# Patient Record
Sex: Female | Born: 1980 | Race: White | Hispanic: No | Smoking: Current every day smoker
Health system: Southern US, Community
[De-identification: ages and names within clinical notes are randomized; demographics above are authoritative.]

## PROBLEM LIST (undated history)

## (undated) DIAGNOSIS — G8929 Other chronic pain: Secondary | ICD-10-CM

## (undated) DIAGNOSIS — F1910 Other psychoactive substance abuse, uncomplicated: Secondary | ICD-10-CM

## (undated) DIAGNOSIS — S322XXA Fracture of coccyx, initial encounter for closed fracture: Secondary | ICD-10-CM

## (undated) DIAGNOSIS — S22000A Wedge compression fracture of unspecified thoracic vertebra, initial encounter for closed fracture: Secondary | ICD-10-CM

## (undated) DIAGNOSIS — M549 Dorsalgia, unspecified: Secondary | ICD-10-CM

## (undated) DIAGNOSIS — N83209 Unspecified ovarian cyst, unspecified side: Secondary | ICD-10-CM

## (undated) DIAGNOSIS — IMO0002 Reserved for concepts with insufficient information to code with codable children: Secondary | ICD-10-CM

## (undated) HISTORY — PX: TUBAL LIGATION: SHX77

## (undated) HISTORY — PX: OVARIAN CYST REMOVAL: SHX89

## (undated) HISTORY — PX: LAPAROSCOPY: SHX197

---

## 2009-12-10 ENCOUNTER — Emergency Department (HOSPITAL_COMMUNITY): Admission: EM | Admit: 2009-12-10 | Discharge: 2009-12-10 | Payer: Self-pay | Admitting: Emergency Medicine

## 2010-02-27 ENCOUNTER — Emergency Department (HOSPITAL_COMMUNITY): Admission: EM | Admit: 2010-02-27 | Discharge: 2010-02-27 | Payer: Self-pay | Admitting: Emergency Medicine

## 2010-07-15 ENCOUNTER — Emergency Department (HOSPITAL_COMMUNITY)
Admission: EM | Admit: 2010-07-15 | Discharge: 2010-07-15 | Payer: Self-pay | Source: Home / Self Care | Admitting: Emergency Medicine

## 2010-10-04 LAB — HEPATIC FUNCTION PANEL
ALT: 31 U/L (ref 0–35)
Albumin: 4.1 g/dL (ref 3.5–5.2)
Alkaline Phosphatase: 64 U/L (ref 39–117)
Total Bilirubin: 0.5 mg/dL (ref 0.3–1.2)
Total Protein: 7.3 g/dL (ref 6.0–8.3)

## 2010-10-04 LAB — URINALYSIS, ROUTINE W REFLEX MICROSCOPIC
Glucose, UA: NEGATIVE mg/dL
Nitrite: NEGATIVE
Protein, ur: NEGATIVE mg/dL
pH: 5.5 (ref 5.0–8.0)

## 2010-10-04 LAB — POCT PREGNANCY, URINE: Preg Test, Ur: NEGATIVE

## 2010-10-04 LAB — CBC
HCT: 45.5 % (ref 36.0–46.0)
MCH: 32 pg (ref 26.0–34.0)
MCHC: 34.1 g/dL (ref 30.0–36.0)
Platelets: 299 10*3/uL (ref 150–400)
RBC: 4.84 MIL/uL (ref 3.87–5.11)

## 2010-10-04 LAB — POCT I-STAT, CHEM 8
BUN: 20 mg/dL (ref 6–23)
Chloride: 106 mEq/L (ref 96–112)
Creatinine, Ser: 0.8 mg/dL (ref 0.4–1.2)
HCT: 48 % — ABNORMAL HIGH (ref 36.0–46.0)
Hemoglobin: 16.3 g/dL — ABNORMAL HIGH (ref 12.0–15.0)
Potassium: 3.4 mEq/L — ABNORMAL LOW (ref 3.5–5.1)
TCO2: 26 mmol/L (ref 0–100)

## 2010-10-04 LAB — DIFFERENTIAL
Basophils Relative: 0 % (ref 0–1)
Lymphocytes Relative: 11 % — ABNORMAL LOW (ref 12–46)
Monocytes Absolute: 0.6 10*3/uL (ref 0.1–1.0)
Neutro Abs: 10 10*3/uL — ABNORMAL HIGH (ref 1.7–7.7)
Neutrophils Relative %: 83 % — ABNORMAL HIGH (ref 43–77)

## 2010-10-04 LAB — LIPASE, BLOOD: Lipase: 38 U/L (ref 11–59)

## 2010-11-29 ENCOUNTER — Emergency Department (HOSPITAL_COMMUNITY)
Admission: EM | Admit: 2010-11-29 | Discharge: 2010-11-29 | Discharge status: Against Medical Advice | Payer: Self-pay | Attending: Emergency Medicine | Admitting: Emergency Medicine

## 2010-11-29 DIAGNOSIS — R109 Unspecified abdominal pain: Secondary | ICD-10-CM | POA: Insufficient documentation

## 2010-12-25 ENCOUNTER — Emergency Department (HOSPITAL_COMMUNITY)
Admission: EM | Admit: 2010-12-25 | Discharge: 2010-12-25 | Disposition: A | Discharge status: Home / Self Care | Payer: Self-pay | Attending: Emergency Medicine | Admitting: Emergency Medicine

## 2010-12-25 ENCOUNTER — Emergency Department (HOSPITAL_COMMUNITY): Payer: Self-pay

## 2010-12-25 DIAGNOSIS — R109 Unspecified abdominal pain: Secondary | ICD-10-CM | POA: Insufficient documentation

## 2010-12-25 DIAGNOSIS — K089 Disorder of teeth and supporting structures, unspecified: Secondary | ICD-10-CM | POA: Insufficient documentation

## 2010-12-25 DIAGNOSIS — K029 Dental caries, unspecified: Secondary | ICD-10-CM | POA: Insufficient documentation

## 2010-12-25 DIAGNOSIS — K047 Periapical abscess without sinus: Secondary | ICD-10-CM | POA: Insufficient documentation

## 2010-12-25 DIAGNOSIS — R509 Fever, unspecified: Secondary | ICD-10-CM | POA: Insufficient documentation

## 2010-12-25 DIAGNOSIS — N390 Urinary tract infection, site not specified: Secondary | ICD-10-CM | POA: Insufficient documentation

## 2010-12-25 DIAGNOSIS — R300 Dysuria: Secondary | ICD-10-CM | POA: Insufficient documentation

## 2010-12-25 DIAGNOSIS — R350 Frequency of micturition: Secondary | ICD-10-CM | POA: Insufficient documentation

## 2010-12-25 LAB — CBC
HCT: 41.6 % (ref 36.0–46.0)
Hemoglobin: 14.5 g/dL (ref 12.0–15.0)
Platelets: 260 10*3/uL (ref 150–400)
RDW: 12.4 % (ref 11.5–15.5)

## 2010-12-25 LAB — DIFFERENTIAL
Basophils Absolute: 0 10*3/uL (ref 0.0–0.1)
Eosinophils Absolute: 0 10*3/uL (ref 0.0–0.7)
Lymphs Abs: 1.4 10*3/uL (ref 0.7–4.0)
Monocytes Absolute: 1.2 10*3/uL — ABNORMAL HIGH (ref 0.1–1.0)
Neutrophils Relative %: 85 % — ABNORMAL HIGH (ref 43–77)

## 2010-12-25 LAB — POCT I-STAT, CHEM 8
BUN: 4 mg/dL — ABNORMAL LOW (ref 6–23)
Calcium, Ion: 0.99 mmol/L — ABNORMAL LOW (ref 1.12–1.32)
Chloride: 107 meq/L (ref 96–112)
Creatinine, Ser: 0.8 mg/dL (ref 0.4–1.2)
Glucose, Bld: 106 mg/dL — ABNORMAL HIGH (ref 70–99)
HCT: 44 % (ref 36.0–46.0)
Hemoglobin: 15 g/dL (ref 12.0–15.0)
Potassium: 3.2 meq/L — ABNORMAL LOW (ref 3.5–5.1)
Sodium: 139 mEq/L (ref 135–145)
TCO2: 21 mmol/L (ref 0–100)

## 2010-12-25 LAB — URINALYSIS, ROUTINE W REFLEX MICROSCOPIC
Protein, ur: 30 mg/dL — AB
Specific Gravity, Urine: 1.02 (ref 1.005–1.030)
pH: 6 (ref 5.0–8.0)

## 2010-12-25 LAB — POCT PREGNANCY, URINE: Preg Test, Ur: NEGATIVE

## 2010-12-25 LAB — URINE MICROSCOPIC-ADD ON

## 2011-03-10 ENCOUNTER — Inpatient Hospital Stay (INDEPENDENT_AMBULATORY_CARE_PROVIDER_SITE_OTHER)
Admission: RE | Admit: 2011-03-10 | Discharge: 2011-03-10 | Disposition: A | Discharge status: Home / Self Care | Payer: Self-pay | Source: Ambulatory Visit | Attending: Emergency Medicine | Admitting: Emergency Medicine

## 2011-03-10 DIAGNOSIS — K089 Disorder of teeth and supporting structures, unspecified: Secondary | ICD-10-CM

## 2011-03-10 DIAGNOSIS — K069 Disorder of gingiva and edentulous alveolar ridge, unspecified: Secondary | ICD-10-CM

## 2011-11-22 ENCOUNTER — Emergency Department (HOSPITAL_COMMUNITY)
Admission: EM | Admit: 2011-11-22 | Discharge: 2011-11-22 | Disposition: A | Discharge status: Home / Self Care | Payer: Self-pay | Source: Home / Self Care | Attending: Emergency Medicine | Admitting: Emergency Medicine

## 2011-11-22 ENCOUNTER — Encounter (HOSPITAL_COMMUNITY): Payer: Self-pay | Admitting: *Deleted

## 2011-11-22 ENCOUNTER — Emergency Department (INDEPENDENT_AMBULATORY_CARE_PROVIDER_SITE_OTHER): Payer: Self-pay

## 2011-11-22 DIAGNOSIS — R319 Hematuria, unspecified: Secondary | ICD-10-CM

## 2011-11-22 DIAGNOSIS — M545 Low back pain, unspecified: Secondary | ICD-10-CM

## 2011-11-22 HISTORY — DX: Dorsalgia, unspecified: M54.9

## 2011-11-22 HISTORY — DX: Other chronic pain: G89.29

## 2011-11-22 HISTORY — DX: Fracture of coccyx, initial encounter for closed fracture: S32.2XXA

## 2011-11-22 HISTORY — DX: Unspecified ovarian cyst, unspecified side: N83.209

## 2011-11-22 HISTORY — DX: Wedge compression fracture of unspecified thoracic vertebra, initial encounter for closed fracture: S22.000A

## 2011-11-22 HISTORY — DX: Reserved for concepts with insufficient information to code with codable children: IMO0002

## 2011-11-22 LAB — POCT URINALYSIS DIP (DEVICE)
Bilirubin Urine: NEGATIVE
Nitrite: NEGATIVE
Protein, ur: 30 mg/dL — AB
pH: 6 (ref 5.0–8.0)

## 2011-11-22 MED ORDER — PREDNISONE 20 MG PO TABS: ORAL_TABLET | ORAL | Status: AC

## 2011-11-22 MED ORDER — HYDROCODONE-ACETAMINOPHEN 5-325 MG PO TABS: 2.0000 | ORAL_TABLET | ORAL | Status: AC | PRN

## 2011-11-22 MED ORDER — MELOXICAM 15 MG PO TABS: 15.0000 mg | ORAL_TABLET | Freq: Every day | ORAL | Status: AC

## 2011-11-22 MED ORDER — CYCLOBENZAPRINE HCL 10 MG PO TABS: 10.0000 mg | ORAL_TABLET | Freq: Three times a day (TID) | ORAL | Status: AC | PRN

## 2011-11-22 NOTE — ED Provider Notes (Signed)
History     CSN: [id number]  Arrival date & time 11/22/11  1515   First MD Initiated Contact with Patient 11/22/11 1601      Chief Complaint  Patient presents with  . URI    (Consider location/radiation/quality/duration/timing/severity/associated sxs/prior treatment) HPI Comments: Patient reports dull, achy midline lower back pain starting about a week ago after she slipped and fell, landing on her buttocks. Reports occasional "burning" sensation down the front of her right leg.  Pain is worse with flexion of her hips, standing up straight for prolonged periods of time, and getting out of bed. Pain is slightly better with bending forward. She is taking 400 mg ibuprofen without improvement. No nausea, vomiting, fevers, urinary complaints, vaginal complaints. Patient has a history of chronic low back pain, and states that this feels the worsening of her usual back pain. 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 No history of cancer, IVDU, prolonged steroid use.  Patient also reports nasal congestion, sneezing, rhinorrhea, URI-like symptoms, occasional nonproductive cough for the past 3 days. No nausea, vomiting, fevers, shortness of breath. Is taking DayQuil with some relief. 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 Patient's  primary concern is back pain and she would like to address that today.  ROS as noted in HPI. All other ROS negative.   Patient is a 31 y.o. female presenting with URI and back pain. The history is provided by the patient. No language interpreter was used.  URI Primary symptoms do not include fever or abdominal pain.  Back Pain  This is a recurrent problem. The current episode started more than 2 days ago. The problem occurs constantly. The problem has been gradually  worsening. The pain is associated with falling. The pain is present in the lumbar spine. The quality of the pain is described as aching. The pain radiates to the right thigh. The symptoms are aggravated by bending and certain positions. The pain is worse during the day. Stiffness is present in the morning. Associated symptoms include leg pain and paresthesias. Pertinent negatives include no chest pain, no fever, no numbness, no weight loss, no abdominal pain, no abdominal swelling, no bowel incontinence, no perianal numbness, no bladder incontinence, no dysuria, no pelvic pain, no paresis, no tingling and no weakness. She has tried NSAIDs for the symptoms. The treatment provided no relief.    Past Medical History  Diagnosis Date  . Cystic fibrosis   . DDD (degenerative disc disease)   . Thoracic compression fracture   . Back pain, chronic   . Coccygeal fracture   . Ovarian cyst     Past Surgical History  Procedure Date  . Laparoscopy   . Ovarian cyst removal   . Tubal ligation     Family History  Problem Relation Age of Onset  . Heart failure Mother   . Diabetes Mother   . Hypertension Father   . Heart failure Other   . Diabetes Other   . Cancer Other     History  Substance Use Topics  . Smoking status: Current Everyday Smoker -- 1.0 packs/day for 10 years    Types: Cigarettes  . Smokeless tobacco: Not on file  . Alcohol Use: Yes     occasional    OB History    Grav Para Term Preterm Abortions  TAB SAB Ect Mult Living                  Review of Systems  Constitutional: Negative for fever and weight loss.  Cardiovascular: Negative for chest pain.  Gastrointestinal: Negative for abdominal pain and bowel incontinence.  Genitourinary: Negative for bladder incontinence, dysuria and pelvic pain.  Musculoskeletal: Positive for back pain.  Neurological: Positive for paresthesias. Negative for tingling, weakness and numbness.    Allergies  Compazine and Phenergan  Home  Medications   Current Outpatient Rx  Name Route Sig Dispense Refill  . IBUPROFEN 400 MG PO TABS Oral Take 400 mg by mouth every 6 (six) hours as needed.    Marland Kitchen DAYQUIL PO Oral Take by mouth.      BP 123/74  Pulse 68  Temp(Src) 98.1 F (36.7 C) (Oral)  Resp 18  SpO2 100%  LMP 11/08/2011  Physical Exam  Nursing note and vitals reviewed. Constitutional: She is oriented to person, place, and time. She appears well-developed and well-nourished. No distress.  HENT:  Head: Normocephalic and atraumatic.  Eyes: Conjunctivae and EOM are normal.  Neck: Normal range of motion.  Cardiovascular: Normal rate and normal heart sounds.   Pulmonary/Chest: Effort normal and breath sounds normal.  Abdominal: Soft. Bowel sounds are normal. She exhibits no distension and no mass. There is no tenderness. There is no rebound and no guarding.  Musculoskeletal: Normal range of motion.       Lumbar back: She exhibits normal range of motion, no swelling and no edema.       Back:       Bilateral lower extremities nontender, baseline ROM with intact  PT pulses. No pain with PROM hips bilaterally. Pain in lumbar spine aggravated with SLR, but SLR neg bilaterally. Sensation baseline light touch bilaterally for Pt, DTR's symmetric and intact bilaterally KJ, pain aggravated with hip flexion. Motor symmetric bilateral 5/5 hip flexion, quadriceps, hamstrings, EHL, foot dorsiflexion, foot plantarflexion, gait somewhat antalgic but without apparent new ataxia.   Neurological: She is alert and oriented to person, place, and time.  Skin: Skin is warm and dry.  Psychiatric: She has a normal mood and affect. Her behavior is normal. Judgment and thought content normal.    ED Course  Procedures (including critical care time)  Labs Reviewed  POCT URINALYSIS DIP (DEVICE) - Abnormal; Notable for the following:    Hgb urine dipstick SMALL (*)    Protein, ur 30 (*)    All other components within normal limits  POCT  PREGNANCY, URINE   Dg Lumbar Spine Complete  11/22/2011  *RADIOLOGY REPORT*  Clinical Data: 31 year old female status post fall with pain.  LUMBAR SPINE - COMPLETE 4+ VIEW  Comparison: 02/27/2010. Chest radiographs 12/25/2010.  Findings: The lowest ribs appear to be those at the T12 level given the comparison chest.  Therefore, the L5 level with sacralized as designated on the 2011 comparison.  Chronic L3-L4 and L4-L5 disc space loss.  Stable mild retrolisthesis of L4 on L5.  Stable vertebral height and alignment. Other lumbar disc spaces are relatively preserved.  No pars fracture.  Left hemi pelvis tubal ligation clip. The second clip projects in the left upper quadrant as before.  SI joints within normal limits.  IMPRESSION: Stable lumbar spine with chronic L3-L4 and L4-L5 disc degeneration. Note transitional anatomy - correlation with radiographs is recommended prior to any operative intervention.  Original Report Authenticated By: Harley Hallmark, M.D.     No diagnosis found.  Results for orders placed during the hospital encounter of 11/22/11  POCT URINALYSIS DIP (DEVICE)      Component Value Range   Glucose, UA NEGATIVE  NEGATIVE (mg/dL)   Bilirubin Urine NEGATIVE  NEGATIVE    Ketones, ur NEGATIVE  NEGATIVE (mg/dL)   Specific Gravity, Urine 1.020  1.005 - 1.030    Hgb urine dipstick SMALL (*) NEGATIVE    pH 6.0  5.0 - 8.0    Protein, ur 30 (*) NEGATIVE (mg/dL)   Urobilinogen, UA 0.2  0.0 - 1.0 (mg/dL)   Nitrite NEGATIVE  NEGATIVE    Leukocytes, UA NEGATIVE  NEGATIVE   POCT PREGNANCY, URINE      Component Value Range   Preg Test, Ur NEGATIVE  NEGATIVE     MDM  Checking L-spine x-ray, as patient has history of trauma to the area, mild tenderness at L5/S1. Checking UA to rule out uti, nephrolithiasis.  [place] narcotic database reviewed. Pt with no narcotic rx since 01/04/2011.    X-rays reviewed by myself. Report per radiologist. Discussed imaging with patient. Patient says she is  unable to afford to see an orthopedic surgeon, but would like a referral to [facility]. Offered her referral to physical therapy, but patient states that this is not worked well for her in the past. Udip noted. Patient has no urinary complaints. Discussed results with patient. In the absence a history of kidney stones, CVA tenderness, suprapubic tenderness or other signs suggestive of  kidney stones or UTI, will have this reevaluated by a primary care physician.  Also discussed  following up with  the family practice center for ongoing routine medical care. She will need to have her urine rechecked to make sure there is not persistent hematuria. Patient agrees with plan     Luiz Blare, MD 11/22/11 2314

## 2011-11-22 NOTE — Discharge Instructions (Signed)
,   Family practice center first thing tomorrow. They will take her 80 patient to call, regardless of your insurance status. You need to have a primary care physician for routine medical care, and the followup on the blood that we found in urine today. Take medication as written. You may take 1 g of Tylenol up to 4 times a day. This with the meloxicam is an effective combination for pain. Do not take the Norco and the Tylenol, as he does have Tylenol in them, and too much can hurt your liver. Start doing the back pain exercises that you already have at home. Continue ice, and gentle stretching. Followup with the [facility] for ongoing management and physical therapy if needed. Return for fever above 100.4, urinary or bowel incontinence, numbness in legs, or any other concerns.

## 2011-11-22 NOTE — ED Notes (Signed)
1 week ago pt slipped and fell at home.  She has had constant low back  Pain radiating to the right leg since then, and it is worse today.     3 days ago she started having a runny nose , the next day she started coughing--somewhat productive.  Also she c/o left breast/chest area X 2 weeks.

## 2011-12-21 ENCOUNTER — Emergency Department (INDEPENDENT_AMBULATORY_CARE_PROVIDER_SITE_OTHER)
Admission: EM | Admit: 2011-12-21 | Discharge: 2011-12-21 | Disposition: A | Discharge status: Home / Self Care | Payer: Self-pay | Source: Home / Self Care | Attending: Family Medicine | Admitting: Family Medicine

## 2011-12-21 ENCOUNTER — Encounter (HOSPITAL_COMMUNITY): Payer: Self-pay | Admitting: *Deleted

## 2011-12-21 DIAGNOSIS — F419 Anxiety disorder, unspecified: Secondary | ICD-10-CM

## 2011-12-21 DIAGNOSIS — M62838 Other muscle spasm: Secondary | ICD-10-CM

## 2011-12-21 DIAGNOSIS — F411 Generalized anxiety disorder: Secondary | ICD-10-CM

## 2011-12-21 MED ORDER — HYDROXYZINE HCL 50 MG PO TABS: 50.0000 mg | ORAL_TABLET | Freq: Three times a day (TID) | ORAL | Status: AC | PRN

## 2011-12-21 MED ORDER — SODIUM CHLORIDE 0.9 % IV BOLUS (SEPSIS): 500.0000 mL | Freq: Once | INTRAVENOUS | Status: DC

## 2011-12-21 MED ORDER — CYCLOBENZAPRINE HCL 10 MG PO TABS: 10.0000 mg | ORAL_TABLET | Freq: Two times a day (BID) | ORAL | Status: AC | PRN

## 2011-12-21 NOTE — Discharge Instructions (Signed)
My impression is that you are experiencing anxiety symptoms. Also of your symptoms are more compatible with muscle sprain/spasm. Your EKG (electrocardiogram activity test) and physical examination are normal today. Take the prescribed medications as instructed. Be aware that Flexeril can make you drowsy and he should not take this medication before driving. Can take both medications at nighttime or what her home. During daytime he can take over-the-counter ibuprofen or Tylenol as needed for pain if you need to be alert and active. You can call the behavioral health outpatient clinic at [phone] to make an appointment, also the crisis help line works 24 hours 7 days a week and the number is [phone].

## 2011-12-21 NOTE — ED Provider Notes (Signed)
History     CSN: [id number]  Arrival date & time 12/21/11  1011   First MD Initiated Contact with Patient 12/21/11 1102      Chief Complaint  Patient presents with  . Back Pain    (Consider location/radiation/quality/duration/timing/severity/associated sxs/prior treatment) HPI Comments: 31 y/o female here c/o pain in left side of chest, breast and left shoulder for weeks. Pain triggered with movement of left arm. No worse with exertion. No known trauma. No nipple discharge. States she works in a [employer] and has to do a lot of physical work, from cooking to cleaning. She is right handed. No headache, dizziness, dysuria, fever or chills. No abdominal pain, N/V/D. Feels stressed and anxious. Worried all the time. Lives with 2 school age children.  Denies alcohol or illicit drug use. She smokes a lot. States her grand mother on father side had breast cancer and her father has CHF. Denies early death in the family due to CAD. "Just wants to make sure Im fine". No sleeping well. Denies suicidal or homicidal ideation. Has never been diagnosed with depression or anxiety. Does not have a PCP.    Past Medical History  Diagnosis Date  . Cystic fibrosis   . DDD (degenerative disc disease)   . Thoracic compression fracture   . Back pain, chronic   . Coccygeal fracture   . Ovarian cyst     Past Surgical History  Procedure Date  . Laparoscopy   . Ovarian cyst removal   . Tubal ligation     Family History  Problem Relation Age of Onset  . Heart failure Mother   . Diabetes Mother   . Hypertension Father   . Heart failure Other   . Diabetes Other   . Cancer Other     History  Substance Use Topics  . Smoking status: Current Everyday Smoker -- 1.0 packs/day for 10 years    Types: Cigarettes  . Smokeless tobacco: Not on file  . Alcohol Use: Yes     occasional    OB History    Grav Para Term Preterm Abortions TAB SAB Ect Mult Living                  Review of  Systems  Constitutional: Negative for fever, chills, diaphoresis, activity change, appetite change, fatigue and unexpected weight change.  Eyes: Negative for visual disturbance.  Respiratory: Negative for cough, chest tightness, shortness of breath and wheezing.   Genitourinary: Negative for dysuria, urgency, frequency, flank pain, vaginal bleeding, vaginal discharge and pelvic pain.  Musculoskeletal: Positive for back pain.       Has chronic back pain.  Skin: Negative for rash.  Neurological: Negative for dizziness, tremors, seizures, weakness, numbness and headaches.    Allergies  Compazine and Phenergan  Home Medications   Current Outpatient Rx  Name Route Sig Dispense Refill  . CYCLOBENZAPRINE HCL 10 MG PO TABS Oral Take 1 tablet (10 mg total) by mouth 2 (two) times daily as needed for muscle spasms. 20 tablet 0  . HYDROXYZINE HCL 50 MG PO TABS Oral Take 1 tablet (50 mg total) by mouth every 8 (eight) hours as needed for itching or anxiety. 30 tablet 0  . MELOXICAM 15 MG PO TABS Oral Take 1 tablet (15 mg total) by mouth daily. 14 tablet 0    BP 112/75  Pulse 104  Temp(Src) 98.4 F (36.9 C) (Oral)  Resp 18  SpO2 99%  LMP 11/08/2011  Physical Exam  Nursing note and vitals reviewed. Constitutional: She is oriented to person, place, and time. She appears well-developed and well-nourished. No distress.  HENT:  Head: Normocephalic and atraumatic.  Mouth/Throat: No oropharyngeal exudate.  Eyes: Conjunctivae are normal. No scleral icterus.  Neck: Normal range of motion. Neck supple. No JVD present.  Cardiovascular: Normal rate, regular rhythm, normal heart sounds and intact distal pulses.  Exam reveals no gallop and no friction rub.   No murmur heard. Pulmonary/Chest: Effort normal and breath sounds normal. No respiratory distress. She has no wheezes. She has no rales. She exhibits no tenderness.  Abdominal: Soft. She exhibits no mass. There is no tenderness.  Genitourinary:        Breast symmetric, no lumps. No skin pitting, thickening, erythema or retractions. No nipple discharge. No axillary adenopathy.  Musculoskeletal:       Diffused tenderness over left trapezius muscle, anterior shoulder joint and pectoralis muscle close to shoulder insertion.. Pain improved with arm abduction. No bruising or skin changes.   Lymphadenopathy:    She has no cervical adenopathy.  Neurological: She is alert and oriented to person, place, and time.  Skin: No rash noted.  Psychiatric: Her behavior is normal. Judgment and thought content normal. Her mood appears anxious. Her speech is rapid and/or pressured. Cognition and memory are normal.    ED Course  Procedures (including critical care time)  Labs Reviewed - No data to display No results found.   1. Anxiety   2. Muscle spasm       MDM  EKG: Normal sinus rhythm, rate 85, no arrhythmia no PVCs or PACs. No ischemic changes. Essentially normal EKG. Treated with hydroxyzine and flexeril. Behavioral health referral.         Sharin Grave, MD 12/22/11 [phone]

## 2012-01-04 ENCOUNTER — Emergency Department (HOSPITAL_COMMUNITY)
Admission: EM | Admit: 2012-01-04 | Discharge: 2012-01-05 | Disposition: A | Discharge status: Home / Self Care | Payer: Self-pay | Attending: Emergency Medicine | Admitting: Emergency Medicine

## 2012-01-04 DIAGNOSIS — G8929 Other chronic pain: Secondary | ICD-10-CM | POA: Insufficient documentation

## 2012-01-04 DIAGNOSIS — Z203 Contact with and (suspected) exposure to rabies: Secondary | ICD-10-CM | POA: Insufficient documentation

## 2012-01-04 DIAGNOSIS — Y92009 Unspecified place in unspecified non-institutional (private) residence as the place of occurrence of the external cause: Secondary | ICD-10-CM | POA: Insufficient documentation

## 2012-01-04 DIAGNOSIS — F17200 Nicotine dependence, unspecified, uncomplicated: Secondary | ICD-10-CM | POA: Insufficient documentation

## 2012-01-04 DIAGNOSIS — W5501XA Bitten by cat, initial encounter: Secondary | ICD-10-CM

## 2012-01-04 DIAGNOSIS — Z23 Encounter for immunization: Secondary | ICD-10-CM | POA: Insufficient documentation

## 2012-01-04 DIAGNOSIS — S61209A Unspecified open wound of unspecified finger without damage to nail, initial encounter: Secondary | ICD-10-CM | POA: Insufficient documentation

## 2012-01-04 DIAGNOSIS — IMO0001 Reserved for inherently not codable concepts without codable children: Secondary | ICD-10-CM | POA: Insufficient documentation

## 2012-01-04 DIAGNOSIS — IMO0002 Reserved for concepts with insufficient information to code with codable children: Secondary | ICD-10-CM | POA: Insufficient documentation

## 2012-01-04 NOTE — ED Notes (Signed)
Pt has been caring for a young kitten x last few days and was bit tonight.  She is worried it may have rabies.

## 2012-01-04 NOTE — ED Notes (Signed)
Pt states kitten that is 26 weeks old that has bite marks on neck and is acting "sick" bit her on thumb breaking the skin. Pt states she is concerned the cat has rabies. Pt denies any complaints.

## 2012-01-05 ENCOUNTER — Encounter (HOSPITAL_COMMUNITY): Payer: Self-pay

## 2012-01-05 MED ORDER — RABIES VACCINE, PCEC IM SUSR
1.0000 mL | Freq: Once | INTRAMUSCULAR | Status: AC
  Administered 2012-01-05: 1 mL via INTRAMUSCULAR
  Filled 2012-01-05: qty 1

## 2012-01-05 MED ORDER — RABIES IMMUNE GLOBULIN 150 UNIT/ML IM INJ
20.0000 [IU]/kg | INJECTION | Freq: Once | INTRAMUSCULAR | Status: AC
  Administered 2012-01-05: 150 [IU] via INTRAMUSCULAR
  Filled 2012-01-05: qty 9.5

## 2012-01-05 MED ORDER — TETANUS-DIPHTH-ACELL PERTUSSIS 5-2.5-18.5 LF-MCG/0.5 IM SUSP
0.5000 mL | Freq: Once | INTRAMUSCULAR | Status: AC
  Administered 2012-01-05: 0.5 mL via INTRAMUSCULAR
  Filled 2012-01-05: qty 0.5

## 2012-01-05 NOTE — Discharge Instructions (Signed)
You were seen and evaluated for your cat bite. At this time your providers recommend that he followup with animal control to have the cat watched and assessed for rabies. Your information was faxed to the animal control office. He should also followup by calling their office tomorrow. You should also watch for any signs for infection to the skin. This may include swelling, redness increasing pain, bleeding or discharge. Return to the emergency room if you develop any concerning signs for infection or if you develop any fever, chills, sweats, fatigue, body aches or headache.   Animal Bite An animal bite can result in a scratch on the skin, deep open cut, puncture of the skin, crush injury, or tearing away of the skin or a body part. Dogs are responsible for most animal bites. Children are bitten more often than adults. An animal bite can range from very mild to more serious. A small bite from your house pet is no cause for alarm. However, some animal bites can become infected or injure a bone or other tissue. You must seek medical care if:  The skin is broken and bleeding does not slow down or stop after 15 minutes.   The puncture is deep and difficult to clean (such as a cat bite).   Pain, warmth, redness, or pus develops around the wound.   The bite is from a stray animal or rodent. There may be a risk of rabies infection.   The bite is from a snake, raccoon, skunk, fox, coyote, or bat. There may be a risk of rabies infection.   The person bitten has a chronic illness such as diabetes, liver disease, or cancer, or the person takes medicine that lowers the immune system.   There is concern about the location and severity of the bite.  It is important to clean and protect an animal bite wound right away to prevent infection. Follow these steps:  Clean the wound with plenty of water and soap.   Apply an antibiotic cream.   Apply gentle pressure over the wound with a clean towel or gauze to  slow or stop bleeding.   Elevate the affected area above the heart to help stop any bleeding.   Seek medical care. Getting medical care within 8 hours of the animal bite leads to the best possible outcome.  DIAGNOSIS  Your caregiver will most likely:  Take a detailed history of the animal and the bite injury.   Perform a wound exam.   Take your medical history.  Blood tests or X-rays may be performed. Sometimes, infected bite wounds are cultured and sent to a lab to identify the infectious bacteria.  TREATMENT  Medical treatment will depend on the location and type of animal bite as well as the patient's medical history. Treatment may include:  Wound care, such as cleaning and flushing the wound with saline solution, bandaging, and elevating the affected area.   Antibiotics.   Tetanus immunization.   Rabies immunization.   Leaving the wound open to heal. This is often done with animal bites, due to the high risk of infection. However, in certain cases, wound closure with stitches, wound adhesive, skin adhesive strips, or staples may be used.  Infected bites that are left untreated may require intravenous (IV) antibiotics and surgical treatment in the hospital. HOME CARE INSTRUCTIONS  Follow your caregiver's instructions for wound care.   Take all medicines as directed.   If your caregiver prescribes antibiotics, take them as directed. Finish them  even if you start to feel better.   Follow up with your caregiver for further exams or immunizations as directed.  You may need a tetanus shot if:  You cannot remember when you had your last tetanus shot.   You have never had a tetanus shot.   The injury broke your skin.  If you get a tetanus shot, your arm may swell, get red, and feel warm to the touch. This is common and not a problem. If you need a tetanus shot and you choose not to have one, there is a rare chance of getting tetanus. Sickness from tetanus can be serious. SEEK  MEDICAL CARE IF:  You notice warmth, redness, soreness, swelling, pus discharge, or a bad smell coming from the wound.   You have a red line on the skin coming from the wound.   You have a fever, chills, or a general ill feeling.   You have nausea or vomiting.   You have continued or worsening pain.   You have trouble moving the injured part.   You have other questions or concerns.  MAKE SURE YOU:  Understand these instructions.   Will watch your condition.   Will get help right away if you are not doing well or get worse.  Document Released: 03/29/2011 Document Revised: 06/30/2011 Document Reviewed: 03/29/2011 [facility] Patient Information 2012 [place], [place].    RESOURCE GUIDE  Chronic Pain Problems: Contact [facility]  [phone] Patients need to be referred by their primary care doctor.  Insufficient Money for Medicine: Contact United Way:  call "211" or [facility] [phone].  No Primary Care Doctor: - Call [facility]  [phone] - can help you locate a primary care doctor that  accepts your insurance, provides certain services, etc. - Physician Referral [phone]  Agencies that provide inexpensive medical care: - [facility]  [phone] - [facility]  [phone] - [facility]  [phone] [facility]  [phone] - [facility]  [phone] [facility]  [phone]  Medicaid-accepting [facility] Providers: - [facility]- [address]  [phone], Mon-Fri 9am-7pm, Sat 9am-1pm - [facility]- [address] [place], [address] [place]  [phone] - [facility]- [address] [place]  [phone] [facility]- [address]  [phone] - Renaye Rakers- [address] [place], [address], [phone]  Only accepts [place] patients after they have their name   applied to their card  Self Pay (no insurance) in [place]: - Sickle Cell Patients: Dr Willey Blade, [facility]  [address] [place], [phone] - [facility]- [address] [place]  [phone]       [facility] [place]- [address]       -     [facility]- see information above (Speak to [employer] if you do not have insurance)       -  [facility]- [address] [place], [phone]       -  [facility]- [address],  [phone]       -  [facility]- [address] [phone]       -  Dr Julio Sicks-  [address], [place], [phone]       -  [facility]- [address] [phone]       -  [facility]- [address], [phone], also [address], [phone]       -    [facility]- [address] [place], [phone], 1st & 3rd Saturday   every month, 10am-1pm  1) Find a Doctor and Pay Out of Pocket Although you won't have to find out who is covered by your insurance plan, it is a good idea to ask around and get recommendations. You will then need to call the office and see if the doctor you have chosen will accept you as a new patient and what types of options they offer for patients who are self-pay. Some doctors offer discounts or will set up payment plans for their patients who do not have insurance, but you will need to ask so you aren't surprised when you get to your appointment.  2) Contact Your Local Health Department Not all health departments have doctors that can see patients for sick visits, but many do, so it is worth a call to see if yours does. If you don't know where your local health department is, you can check in your phone book. The CDC also has a tool to help you locate your state's health department, and many state websites also have listings of all of their local health departments.  3) Find a Walk-in Clinic If your  illness is not likely to be very severe or complicated, you may want to try a walk in clinic. These are popping up all over the country in pharmacies, drugstores, and shopping centers. They're usually staffed by nurse practitioners or physician assistants that have been trained to treat common illnesses and complaints. They're usually fairly quick and inexpensive. However, if you have serious medical issues or chronic medical problems, these are probably not your best option  STD Testing - [facility] Department of [facility], STD Clinic, [address] [place], phone [phone] or [phone].  Monday - Friday, call for an appointment. [facility] Department of [employer], STD Clinic, [address], [place], phone [phone] or [phone].  Monday - Friday, call for an appointment.  Abuse/Neglect: [facility] Child Abuse Hotline (754)205-0377 [facility] Child Abuse Hotline (417)096-9905 (After Hours)  Emergency Shelter:  [facility] (401)827-4229  Maternity Homes: - [facility] [place] [facility] 2764788622 - [facility] (320) 530-8779  MRSA Hotline #:   (317) 394-1939  [facility] Resources  [facility] [place]  [employer] [facility] Dept. [address]                 [address]         [address] [place] [address]  [place]                                               Cristobal Goldmann Phone:  [phone]  Phone:  [phone]                   Phone:  [phone]  [facility], [phone] - [facility] - [phone]       -     [facility] in [place], [address],                                  [phone], [facility] Child Abuse Hotline (205)506-3644 or 423-708-7471 (After  Hours)   Behavioral Health Services  Substance Abuse Resources: - [facility] Drug [facility]  704-801-0698 - [facility] (669) 126-5722 - [facility] (747) 102-0763 Floydene Flock 819-418-1726 - Residential & Outpatient Substance Abuse Program  [phone]  Psychological Services: [facility]  [phone] [facility]  [phone] - [facility], [phone] [place]. [address] [place], ACCESS LINE: [phone] or [phone], [employer]  Dental Assistance  If unable to pay or uninsured, contact:  [facility] or [facility]. to become qualified for the adult dental clinic.  Patients with Medicaid: [facility] [phone] [address], [phone] [address], [phone]  If unable to pay, or uninsured, contact [facility] (218) 876-4665) or [facility] Department [phone] in [place], [phone] in [facility]) to become qualified for the adult dental clinic  Other Low-Cost Community Dental Services: - [facility]- [address] [place], [place], [place], [address], [phone], [address], 2nd and 4th Thursday of the month at 6:30am.  10 clients each day by appointment, can sometimes see walk-in patients if someone does not show for an appointment. [facility]- [address] Ether Griffins [place], [place], [address], [phone] - [facility]- [address], [place], [place], [address], [phone] - [facility]- [phone] [facility] Health Department- [phone] [facility] Department- [phone]

## 2012-01-05 NOTE — ED Provider Notes (Signed)
History     CSN: [id number]  Arrival date & time 01/04/12  2242   First MD Initiated Contact with Patient 01/05/12 0038      Chief Complaint  Patient presents with  . Animal Bite    HPI  History provided by the patient. Patient is a 31 year old female with prior history of cystic fibrosis, degenerative disc disease and chronic back pain who presents with concerns for a bite from a cat. Patient states that she found a small stray cat and was trying to help with earlier this morning and had a small bite to left thumb. Patient became worried because the cat seem to be injured or have difficulty walking she. Patient also seemed to have loss of hair and some kind infection to the neck area. She was worried that after researching on the Internet the cat may have rabies. The cat is currently separated in the room at her house. The cat bite left a small superficial puncture to her thumb but did not cause any bleeding. Patient has not had any other symptoms.     Past Medical History  Diagnosis Date  . Cystic fibrosis   . DDD (degenerative disc disease)   . Thoracic compression fracture   . Back pain, chronic   . Coccygeal fracture   . Ovarian cyst     Past Surgical History  Procedure Date  . Laparoscopy   . Ovarian cyst removal   . Tubal ligation     Family History  Problem Relation Age of Onset  . Heart failure Mother   . Diabetes Mother   . Hypertension Father   . Heart failure Other   . Diabetes Other   . Cancer Other     History  Substance Use Topics  . Smoking status: Current Everyday Smoker -- 1.0 packs/day for 10 years    Types: Cigarettes  . Smokeless tobacco: Not on file  . Alcohol Use: Yes     occasional    OB History    Grav Para Term Preterm Abortions TAB SAB Ect Mult Living                  Review of Systems  Constitutional: Negative for fever and chills.  Gastrointestinal: Negative for nausea, vomiting and abdominal pain.  Skin: Negative for rash.   Neurological: Negative for dizziness, light-headedness and headaches.    Allergies  Compazine and Phenergan  Home Medications   Current Outpatient Rx  Name Route Sig Dispense Refill  . MELOXICAM 15 MG PO TABS Oral Take 1 tablet (15 mg total) by mouth daily. 14 tablet 0    BP 119/74  Pulse 101  Temp 98.7 F (37.1 C) (Oral)  Resp 16  SpO2 100%  LMP 01/04/2012  Physical Exam  Nursing note and vitals reviewed. Constitutional: She is oriented to person, place, and time. She appears well-developed and well-nourished. No distress.  HENT:  Head: Normocephalic.  Cardiovascular: Normal rate and regular rhythm.   Pulmonary/Chest: Effort normal and breath sounds normal.  Abdominal: Soft.  Neurological: She is alert and oriented to person, place, and time.  Skin: Skin is warm and dry.        There is a small superficial puncture break of the skin on the left thumb pad.  Psychiatric: She has a normal mood and affect. Her behavior is normal.    ED Course  Procedures       1. Cat bite       MDM  Patient  seen and evaluated. Patient in no acute distress.  Rabies prophylaxis given. Papers faxed to animal control. Patient given instructions for followup vaccination schedule.      Angus Seller, PA 01/05/12 0210

## 2012-01-05 NOTE — ED Provider Notes (Signed)
Medical screening examination/treatment/procedure(s) were performed by non-physician practitioner and as supervising physician I was immediately available for consultation/collaboration.  Libbey Duce, MD 01/05/12 2356 

## 2012-01-05 NOTE — ED Notes (Signed)
SHOTS TOLERATED WELL. READY FOR D/C

## 2012-11-13 ENCOUNTER — Encounter (HOSPITAL_COMMUNITY): Payer: Self-pay | Admitting: *Deleted

## 2012-11-13 ENCOUNTER — Emergency Department (HOSPITAL_COMMUNITY)
Admission: EM | Admit: 2012-11-13 | Discharge: 2012-11-13 | Disposition: A | Discharge status: Home / Self Care | Payer: Medicaid Other | Source: Home / Self Care | Attending: Family Medicine | Admitting: Family Medicine

## 2012-11-13 DIAGNOSIS — M545 Low back pain, unspecified: Secondary | ICD-10-CM

## 2012-11-13 DIAGNOSIS — G8929 Other chronic pain: Secondary | ICD-10-CM

## 2012-11-13 MED ORDER — CYCLOBENZAPRINE HCL 5 MG PO TABS: 5.0000 mg | ORAL_TABLET | Freq: Three times a day (TID) | ORAL | Status: DC | PRN

## 2012-11-13 MED ORDER — DICLOFENAC POTASSIUM 50 MG PO TABS: 50.0000 mg | ORAL_TABLET | Freq: Three times a day (TID) | ORAL | Status: DC

## 2012-11-13 NOTE — ED Notes (Signed)
Pt  Reports  History  Of  Back  Problems  In  Past    -   Pt  Reports  She  Felled  sev  Days  Ago  And     Developed  Pain  Pt  Reports  Pain  Radiates  Down legs    Pt denys  Any urinary  Symptoms    Pt  Ambulates  Slowly  With a   Bent over posture

## 2012-11-13 NOTE — ED Provider Notes (Signed)
History     CSN: [id number]  Arrival date & time 11/13/12  1001   First MD Initiated Contact with Patient 11/13/12 1011      Chief Complaint  Patient presents with  . Back Pain    (Consider location/radiation/quality/duration/timing/severity/associated sxs/prior treatment) Patient is a 32 y.o. female presenting with back pain. The history is provided by the patient.  Back Pain Location:  Lumbar spine Quality:  Stabbing Radiates to:  Does not radiate Pain severity:  Mild Onset quality:  Sudden Duration: onset over 10 yrs ago, has seen mult md about problem. Progression:  Waxing and waning Chronicity:  Chronic Relieved by:  Nothing Associated symptoms: no abdominal pain, no bladder incontinence, no bowel incontinence, no numbness, no paresthesias, no pelvic pain, no tingling and no weakness     Past Medical History  Diagnosis Date  . Cystic fibrosis   . DDD (degenerative disc disease)   . Thoracic compression fracture   . Back pain, chronic   . Coccygeal fracture   . Ovarian cyst     Past Surgical History  Procedure Laterality Date  . Laparoscopy    . Ovarian cyst removal    . Tubal ligation      Family History  Problem Relation Age of Onset  . Heart failure Mother   . Diabetes Mother   . Hypertension Father   . Heart failure Other   . Diabetes Other   . Cancer Other     History  Substance Use Topics  . Smoking status: Current Every Day Smoker -- 1.00 packs/day for 10 years    Types: Cigarettes  . Smokeless tobacco: Not on file  . Alcohol Use: Yes     Comment: occasional    OB History   Grav Para Term Preterm Abortions TAB SAB Ect Mult Living                  Review of Systems  Constitutional: Negative.   Gastrointestinal: Negative.  Negative for abdominal pain and bowel incontinence.  Genitourinary: Negative.  Negative for bladder incontinence and pelvic pain.  Musculoskeletal: Positive for back pain. Negative for myalgias, joint swelling and  gait problem.  Skin: Negative.   Neurological: Negative for tingling, weakness, numbness and paresthesias.    Allergies  Compazine and Phenergan  Home Medications   Current Outpatient Rx  Name  Route  Sig  Dispense  Refill  . cyclobenzaprine (FLEXERIL) 5 MG tablet   Oral   Take 1 tablet (5 mg total) by mouth 3 (three) times daily as needed for muscle spasms.   30 tablet   0   . diclofenac (CATAFLAM) 50 MG tablet   Oral   Take 1 tablet (50 mg total) by mouth 3 (three) times daily.   15 tablet   0   . meloxicam (MOBIC) 15 MG tablet   Oral   Take 1 tablet (15 mg total) by mouth daily.   14 tablet   0     BP 128/72  Pulse 72  Temp(Src) 98.6 F (37 C) (Oral)  Resp 14  SpO2 100%  LMP 10/23/2012  Physical Exam  Nursing note and vitals reviewed. Constitutional: She is oriented to person, place, and time. She appears well-developed and well-nourished.  Abdominal: Soft. Bowel sounds are normal.  Musculoskeletal: She exhibits tenderness.       Lumbar back: She exhibits tenderness. She exhibits normal range of motion, no bony tenderness, no swelling, no spasm and normal pulse.  Back:  Neurological: She is alert and oriented to person, place, and time.  Skin: Skin is warm and dry.    ED Course  Procedures (including critical care time)  Labs Reviewed - No data to display No results found.   1. Chronic lumbar pain       MDM          Linna Hoff, MD 11/13/12 1026

## 2013-02-08 ENCOUNTER — Emergency Department (HOSPITAL_COMMUNITY)
Admission: EM | Admit: 2013-02-08 | Discharge: 2013-02-09 | Disposition: A | Discharge status: Home / Self Care | Payer: Medicaid Other | Source: Home / Self Care | Attending: Emergency Medicine | Admitting: Emergency Medicine

## 2013-02-08 ENCOUNTER — Encounter (HOSPITAL_COMMUNITY): Payer: Self-pay | Admitting: Family Medicine

## 2013-02-08 ENCOUNTER — Encounter (HOSPITAL_COMMUNITY): Payer: Self-pay | Admitting: *Deleted

## 2013-02-08 ENCOUNTER — Emergency Department (HOSPITAL_COMMUNITY)
Admission: EM | Admit: 2013-02-08 | Discharge: 2013-02-08 | Disposition: A | Discharge status: Home / Self Care | Payer: Medicaid Other | Attending: Emergency Medicine | Admitting: Emergency Medicine

## 2013-02-08 DIAGNOSIS — Z8639 Personal history of other endocrine, nutritional and metabolic disease: Secondary | ICD-10-CM | POA: Insufficient documentation

## 2013-02-08 DIAGNOSIS — Z8739 Personal history of other diseases of the musculoskeletal system and connective tissue: Secondary | ICD-10-CM | POA: Insufficient documentation

## 2013-02-08 DIAGNOSIS — F1110 Opioid abuse, uncomplicated: Secondary | ICD-10-CM | POA: Insufficient documentation

## 2013-02-08 DIAGNOSIS — Z8781 Personal history of (healed) traumatic fracture: Secondary | ICD-10-CM | POA: Insufficient documentation

## 2013-02-08 DIAGNOSIS — F411 Generalized anxiety disorder: Secondary | ICD-10-CM | POA: Insufficient documentation

## 2013-02-08 DIAGNOSIS — IMO0002 Reserved for concepts with insufficient information to code with codable children: Secondary | ICD-10-CM | POA: Insufficient documentation

## 2013-02-08 DIAGNOSIS — Z8742 Personal history of other diseases of the female genital tract: Secondary | ICD-10-CM | POA: Insufficient documentation

## 2013-02-08 DIAGNOSIS — Z3202 Encounter for pregnancy test, result negative: Secondary | ICD-10-CM | POA: Insufficient documentation

## 2013-02-08 DIAGNOSIS — R000 Tachycardia, unspecified: Secondary | ICD-10-CM | POA: Insufficient documentation

## 2013-02-08 DIAGNOSIS — M549 Dorsalgia, unspecified: Secondary | ICD-10-CM | POA: Insufficient documentation

## 2013-02-08 DIAGNOSIS — G479 Sleep disorder, unspecified: Secondary | ICD-10-CM | POA: Insufficient documentation

## 2013-02-08 DIAGNOSIS — G8929 Other chronic pain: Secondary | ICD-10-CM | POA: Insufficient documentation

## 2013-02-08 DIAGNOSIS — R450 Nervousness: Secondary | ICD-10-CM | POA: Insufficient documentation

## 2013-02-08 DIAGNOSIS — Z791 Long term (current) use of non-steroidal anti-inflammatories (NSAID): Secondary | ICD-10-CM | POA: Insufficient documentation

## 2013-02-08 DIAGNOSIS — R0602 Shortness of breath: Secondary | ICD-10-CM | POA: Insufficient documentation

## 2013-02-08 DIAGNOSIS — F17200 Nicotine dependence, unspecified, uncomplicated: Secondary | ICD-10-CM | POA: Insufficient documentation

## 2013-02-08 DIAGNOSIS — F419 Anxiety disorder, unspecified: Secondary | ICD-10-CM

## 2013-02-08 DIAGNOSIS — R112 Nausea with vomiting, unspecified: Secondary | ICD-10-CM | POA: Insufficient documentation

## 2013-02-08 DIAGNOSIS — Z862 Personal history of diseases of the blood and blood-forming organs and certain disorders involving the immune mechanism: Secondary | ICD-10-CM | POA: Insufficient documentation

## 2013-02-08 DIAGNOSIS — N390 Urinary tract infection, site not specified: Secondary | ICD-10-CM | POA: Insufficient documentation

## 2013-02-08 HISTORY — DX: Other psychoactive substance abuse, uncomplicated: F19.10

## 2013-02-08 LAB — RAPID URINE DRUG SCREEN, HOSP PERFORMED
Barbiturates: NOT DETECTED
Barbiturates: NOT DETECTED
Benzodiazepines: NOT DETECTED
Cocaine: NOT DETECTED
Cocaine: NOT DETECTED
Opiates: POSITIVE — AB
Tetrahydrocannabinol: NOT DETECTED

## 2013-02-08 LAB — URINALYSIS, ROUTINE W REFLEX MICROSCOPIC
Bilirubin Urine: NEGATIVE
Ketones, ur: NEGATIVE mg/dL
Specific Gravity, Urine: 1.034 — ABNORMAL HIGH (ref 1.005–1.030)
Urobilinogen, UA: 1 mg/dL (ref 0.0–1.0)
pH: 6.5 (ref 5.0–8.0)

## 2013-02-08 LAB — CBC
MCH: 31.8 pg (ref 26.0–34.0)
MCHC: 34.7 g/dL (ref 30.0–36.0)
Platelets: 290 10*3/uL (ref 150–400)
RDW: 12.3 % (ref 11.5–15.5)

## 2013-02-08 LAB — COMPREHENSIVE METABOLIC PANEL
ALT: 10 U/L (ref 0–35)
AST: 18 U/L (ref 0–37)
Calcium: 9.6 mg/dL (ref 8.4–10.5)
GFR calc Af Amer: >90 mL/min (ref >90)
Glucose, Bld: 130 mg/dL — ABNORMAL HIGH (ref 70–99)
Sodium: 137 mEq/L (ref 135–145)
Total Protein: 7.2 g/dL (ref 6.0–8.3)

## 2013-02-08 MED ORDER — LORAZEPAM 2 MG/ML IJ SOLN
0.5000 mg | Freq: Once | INTRAMUSCULAR | Status: AC
  Administered 2013-02-08: 0.5 mg via INTRAMUSCULAR
  Filled 2013-02-08: qty 1

## 2013-02-08 MED ORDER — CLONIDINE HCL 0.2 MG PO TABS
0.2000 mg | ORAL_TABLET | Freq: Two times a day (BID) | ORAL | Status: DC
  Administered 2013-02-08 – 2013-02-09 (×2): 0.2 mg via ORAL
  Filled 2013-02-08 (×2): qty 1

## 2013-02-08 MED ORDER — LORAZEPAM 2 MG/ML IJ SOLN
1.0000 mg | Freq: Once | INTRAMUSCULAR | Status: AC
  Administered 2013-02-08: 1 mg via INTRAVENOUS
  Filled 2013-02-08: qty 1

## 2013-02-08 MED ORDER — LORAZEPAM 2 MG/ML IJ SOLN
2.0000 mg | Freq: Once | INTRAMUSCULAR | Status: AC
  Administered 2013-02-08: 2 mg via INTRAVENOUS
  Filled 2013-02-08: qty 1

## 2013-02-08 MED ORDER — LORAZEPAM 1 MG PO TABS: 1.0000 mg | ORAL_TABLET | Freq: Three times a day (TID) | ORAL | Status: DC | PRN

## 2013-02-08 MED ORDER — NITROFURANTOIN MONOHYD MACRO 100 MG PO CAPS: 100.0000 mg | ORAL_CAPSULE | Freq: Two times a day (BID) | ORAL | Status: DC

## 2013-02-08 MED ORDER — ACETAMINOPHEN 325 MG PO TABS
650.0000 mg | ORAL_TABLET | ORAL | Status: DC | PRN
  Administered 2013-02-09: 650 mg via ORAL
  Filled 2013-02-08: qty 2

## 2013-02-08 MED ORDER — LORAZEPAM 0.5 MG PO TABS
0.5000 mg | ORAL_TABLET | Freq: Once | ORAL | Status: AC
  Administered 2013-02-08: 0.5 mg via ORAL
  Filled 2013-02-08: qty 1

## 2013-02-08 NOTE — ED Notes (Addendum)
The pt is still jittery and is requesting  More ativan given iv

## 2013-02-08 NOTE — ED Notes (Signed)
Pt ambulated to restroom with no difficulty and with not restless activity.  Pt informed by Dr. Nicanor Alcon that we cannot "put people to sleep."

## 2013-02-08 NOTE — ED Notes (Signed)
Pt states, "It feels like my skin is crawling."  Pt is tearful.

## 2013-02-08 NOTE — ED Notes (Signed)
Per EMS report: pt from home: Pt has had 4 hours of sleep in the past 2 days.  Pt has been anxious and rocking and forth for about 2 hours.  On ED arrival, pt states, she can't breath and asking for help to make her movements stop. Pt denies any substance use other than Tylenol PM.

## 2013-02-08 NOTE — ED Notes (Addendum)
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 sts she took some this am. sts severe anxiety and withdrawal symptoms when she tries to detox on her own. Denies SI or HI.

## 2013-02-08 NOTE — ED Notes (Signed)
Pt calling for a ride home

## 2013-02-08 NOTE — BH Assessment (Signed)
Assessment Note   Marisa Palmer is an 32 y.o. female seeking detox from Opiates.  She reports she has been trying to stop using pain medications on her own several times, but has been unsuccessful due to the anxiety she experiences.  She states she doesn't want to live like this anymore and wants to better her life.  She has two children, 8 and 12 and shares custody of them.  She currently lives with her boyfriend, who is here for detox as well.  She reports using as much pain medication as she can get.  She rotates between Percocet, Vicodin, and Opana, when she can get it.  She denies SI, HI, and psychosis-now or in the last six months and has no history of suicide attempts or gestures. 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  She is cooperative and motivated for treatment.    Axis I: Substance Induced Mood Disorder and Opioid Dependence Axis II: Deferred Axis III:  Past Medical History  Diagnosis Date  . Cystic fibrosis   . DDD (degenerative disc disease)   . Thoracic compression fracture   . Back pain, chronic   . Coccygeal fracture   . Ovarian cyst    Axis IV: problems with access to health care services and problems with primary support group Axis V: 41-50 serious symptoms  Past Medical History:  Past Medical History  Diagnosis Date  . Cystic fibrosis   . DDD (degenerative disc disease)   . Thoracic compression fracture   . Back pain, chronic   . Coccygeal fracture   . Ovarian cyst     Past Surgical History  Procedure Laterality Date  . Laparoscopy    . Ovarian cyst removal    . Tubal ligation      Family History:  Family History  Problem Relation Age of Onset  . Heart failure Mother   . Diabetes Mother   . Hypertension Father   . Heart failure Other   . Diabetes Other   . Cancer Other     Social History:  reports that she has been smoking Cigarettes.  She has a 10  pack-year smoking history. She does not have any smokeless tobacco history on file. She reports that  drinks alcohol. She reports that she does not use illicit drugs.  Additional Social History:  Alcohol / Drug Use History of alcohol / drug use?: Yes Substance #1 Name of Substance 1: Vicodin 1 - Age of First Use: 31 1 - Amount (size/oz): as much as I can get 1 - Frequency: daily 1 - Duration: 5-10 1 - Last Use / Amount: 02/08/13 a few 02/07/13 am Substance #2 Name of Substance 2: Percocet 2 - Age of First Use: 31 2 - Amount (size/oz): 10 2 - Frequency: daily 2 - Duration: ongoing 2 - Last Use / Amount: a few weeks ago Substance #3 Name of Substance 3: Opana 3 - Age of First Use: 31 3 - Amount (size/oz): 10 3 - Frequency: daily 3 - Duration: months 3 - Last Use / Amount: 02/06/13  CIWA: CIWA-Ar BP: 125/83 mmHg Pulse Rate: 118 COWS:    Allergies:  Allergies  Allergen Reactions  . Compazine (Prochlorperazine) Anxiety  . Phenergan (Promethazine Hcl) Anxiety    Home Medications:  (Not in a hospital admission)  OB/GYN Status:  Patient's last menstrual period was 02/01/2013.  General Assessment Data Location of Assessment: [facility]  Living Arrangements: Other (Comment) (boyfriend, joint custody with children 12, 8) Can pt return to current living arrangement?: Yes Admission Status: Voluntary Is patient capable of signing voluntary admission?: Yes Transfer from: Acute Hospital Referral Source: Self/Family/Friend  Education Status Is patient currently in school?: No Highest grade of school patient has completed: high school  Risk to self Suicidal Ideation: No Suicidal Intent: No Is patient at risk for suicide?: No Suicidal Plan?: No Access to Means: No What has been your use of drugs/alcohol within the last 12 months?: ongoing opiate use Previous Attempts/Gestures: No How many times?: 0 Intentional Self Injurious Behavior: None Family Suicide History: Yes (uncle  commited suicide) Recent stressful life event(s):  (drug use) Persecutory voices/beliefs?: No Depression: Yes Depression Symptoms: Loss of interest in usual pleasures;Guilt;Fatigue;Isolating;Insomnia;Tearfulness Substance abuse history and/or treatment for substance abuse?: Yes Suicide prevention information given to non-admitted patients: Yes  Risk to Others Homicidal Ideation: No Thoughts of Harm to Others: No Current Homicidal Intent: No Current Homicidal Plan: No Access to Homicidal Means: No History of harm to others?: No Assessment of Violence: None Noted Does patient have access to weapons?: No Criminal Charges Pending?: No Does patient have a court date: No  Psychosis Hallucinations: None noted Delusions: None noted  Mental Status Report Appear/Hygiene: Disheveled Eye Contact: Fair Motor Activity: Freedom of movement Speech: Logical/coherent Level of Consciousness: Alert Mood: Depressed Affect: Appropriate to circumstance Anxiety Level: Minimal Thought Processes: Coherent;Relevant Judgement: Unimpaired Orientation: Person;Place;Time;Situation Obsessive Compulsive Thoughts/Behaviors: None  Cognitive Functioning Concentration: Normal Memory: Recent Intact;Remote Intact IQ: Average Insight: Good Impulse Control: Fair Appetite: Poor Weight Loss: 5 Weight Gain: 0 Sleep: Decreased Total Hours of Sleep:  (reports no sleep over last week) Vegetative Symptoms: None  ADLScreening [facility] Assessment Services) Patient's cognitive ability adequate to safely complete daily activities?: Yes Patient able to express need for assistance with ADLs?: Yes Independently performs ADLs?: Yes (appropriate for developmental age)  Abuse/Neglect [facility]) Physical Abuse: Denies Verbal Abuse: Denies Sexual Abuse: Denies  Prior Inpatient Therapy Prior Inpatient Therapy: Yes Prior Therapy Dates: January 2002, November 2005 Prior Therapy Facilty/Provider(s): [place] Reason for  Treatment: Post Partum Psychosis  Prior Outpatient Therapy Prior Outpatient Therapy: No  ADL Screening (condition at time of admission) Patient's cognitive ability adequate to safely complete daily activities?: Yes Is the patient deaf or have difficulty hearing?: No Does the patient have difficulty seeing, even when wearing glasses/contacts?: No Does the patient have difficulty concentrating, remembering, or making decisions?: No Patient able to express need for assistance with ADLs?: Yes Does the patient have difficulty dressing or bathing?: No Independently performs ADLs?: Yes (appropriate for developmental age) Does the patient have difficulty walking or climbing stairs?: No Weakness of Legs: None Weakness of Arms/Hands: None       Abuse/Neglect Assessment (Assessment to be complete while patient is alone) Physical Abuse: Denies Verbal Abuse: Denies Sexual Abuse: Denies Exploitation of patient/patient's resources: Denies     Merchant [employer] officer (For Healthcare) Advance Directive: Patient does not have advance directive;Patient would not like information Pre-existing out of facility DNR order (yellow form or pink MOST form): No Nutrition Screen- [facility] Adult/[facility]/AP Patient's home diet: Regular  Additional Information 1:1 In Past 12 Months?: No CIRT Risk: No Elopement Risk: No Does patient have medical clearance?: Yes     Disposition:  Disposition Initial Assessment Completed for this Encounter: Yes Disposition of Patient: Inpatient treatment program Type of inpatient treatment program: Adult  On Site Evaluation by:  Gary Fleet Reviewed with Physician:  Gary Fleet  Steward Ros 02/08/2013 11:35 PM

## 2013-02-08 NOTE — ED Notes (Signed)
[id number]<NU> Expected date:<BR> Expected time:<BR> Means of arrival:<BR> Comments:<BR> EMS: 32y/o/f anxiety attack

## 2013-02-08 NOTE — ED Notes (Addendum)
Pt states, "I just want to stop moving and be put to sleep."  Palumbo, MD made aware.

## 2013-02-08 NOTE — ED Notes (Signed)
Pt anxious, tearful, crying, c/o "can't breathe", denies cramping, states, "whole body is going to explode" when asked if she had any numbness or tingling, VS rechecked, LS CTA.

## 2013-02-08 NOTE — ED Notes (Signed)
The pt continues to c/o not being able to breathe and asking for a doctor to see her

## 2013-02-08 NOTE — ED Notes (Signed)
The pt is c/o not being able to breathe.  She is crying and her nose is stuffy from crying.  sats are good and she is nervous and jittery.  Husband is at her bedside.  She continues to cry and breathe fast

## 2013-02-08 NOTE — ED Provider Notes (Signed)
History    CSN: [id number] Arrival date & time 02/08/13  1504  First MD Initiated Contact with Patient 02/08/13 2031     Chief Complaint  Patient presents with  . Medical Clearance   (Consider location/radiation/quality/duration/timing/severity/associated sxs/prior Treatment) HPI Comments: Pt w/ long hx of narcotic dependence now w/ opiate w/d. States chronic use for many years 2/2 back pain. 2 days ago tried to quit. Took last vicodin this am. Admits to n/v, agitation, dyspnea, unable to sleep, anxiety. Seeking inpt rehab. Denies SI/HI, no hallucinations.   Patient is a 32 y.o. female presenting with general illness. The history is provided by the patient. No language interpreter was used.  Illness Location:  CNS Quality:  Opiate w/d Severity:  Moderate Onset quality:  Gradual Timing:  Constant Progression:  Worsening Chronicity:  New Associated symptoms: nausea, shortness of breath and vomiting   Associated symptoms: no abdominal pain, no chest pain, no congestion, no cough, no diarrhea, no fever, no headaches, no rash and no sore throat    Past Medical History  Diagnosis Date  . Cystic fibrosis   . DDD (degenerative disc disease)   . Thoracic compression fracture   . Back pain, chronic   . Coccygeal fracture   . Ovarian cyst    Past Surgical History  Procedure Laterality Date  . Laparoscopy    . Ovarian cyst removal    . Tubal ligation     Family History  Problem Relation Age of Onset  . Heart failure Mother   . Diabetes Mother   . Hypertension Father   . Heart failure Other   . Diabetes Other   . Cancer Other    History  Substance Use Topics  . Smoking status: Current Every Day Smoker -- 1.00 packs/day for 10 years    Types: Cigarettes  . Smokeless tobacco: Not on file  . Alcohol Use: Yes     Comment: occasional   OB History   Grav Para Term Preterm Abortions TAB SAB Ect Mult Living                 Review of Systems  Constitutional: Negative for  fever and chills.  HENT: Negative for congestion and sore throat.   Respiratory: Positive for shortness of breath. Negative for cough.   Cardiovascular: Negative for chest pain and leg swelling.  Gastrointestinal: Positive for nausea and vomiting. Negative for abdominal pain, diarrhea and constipation.  Genitourinary: Negative for dysuria and frequency.  Skin: Negative for color change and rash.  Neurological: Negative for dizziness and headaches.  Psychiatric/Behavioral: Positive for sleep disturbance and agitation. Negative for suicidal ideas, confusion and self-injury. The patient is nervous/anxious.   All other systems reviewed and are negative.    Allergies  Compazine and Phenergan  Home Medications   Current Outpatient Rx  Name  Route  Sig  Dispense  Refill  . acetaminophen (TYLENOL) 500 MG tablet   Oral   Take 500 mg by mouth every 6 (six) hours as needed for pain.         Marland Kitchen HYDROcodone-acetaminophen (NORCO/VICODIN) 5-325 MG per tablet   Oral   Take 1 tablet by mouth every 6 (six) hours as needed for pain.         . traMADol (ULTRAM) 50 MG tablet   Oral   Take 50 mg by mouth every 6 (six) hours as needed for pain.         . nitrofurantoin, macrocrystal-monohydrate, (MACROBID) 100 MG capsule  Oral   Take 1 capsule (100 mg total) by mouth 2 (two) times daily. X 7 days   14 capsule   0    BP 125/83  Pulse 118  Temp(Src) 98.7 F (37.1 C) (Oral)  Resp 16  SpO2 95%  LMP 02/01/2013 Physical Exam  Vitals reviewed. Constitutional: She is oriented to person, place, and time. She appears well-developed and well-nourished. No distress.  HENT:  Head: Normocephalic and atraumatic.  Eyes: EOM are normal. Pupils are equal, round, and reactive to light.    Neck: Normal range of motion. Neck supple.  Cardiovascular: Regular rhythm.  Tachycardia present.   Pulmonary/Chest: Effort normal and breath sounds normal. No respiratory distress.  Abdominal: Soft. She  exhibits no distension.  Musculoskeletal: Normal range of motion. She exhibits no edema.  Neurological: She is alert and oriented to person, place, and time. GCS eye subscore is 4. GCS verbal subscore is 5. GCS motor subscore is 6.  Skin: Skin is warm and dry.  Psychiatric: Her behavior is normal. Her mood appears anxious. Her speech is rapid and/or pressured. Thought content is not paranoid and not delusional. She expresses no homicidal and no suicidal ideation. She expresses no suicidal plans and no homicidal plans.    ED Course  Procedures (including critical care time) Results for orders placed during the hospital encounter of 02/08/13  ACETAMINOPHEN LEVEL      Result Value Range   Acetaminophen (Tylenol), Serum <15.0  10 - 30 ug/mL  CBC      Result Value Range   WBC 6.6  4.0 - 10.5 K/uL   RBC 4.53  3.87 - 5.11 MIL/uL   Hemoglobin 14.4  12.0 - 15.0 g/dL   HCT 16.1  09.6 - 04.5 %   MCV 91.6  78.0 - 100.0 fL   MCH 31.8  26.0 - 34.0 pg   MCHC 34.7  30.0 - 36.0 g/dL   RDW 40.9  81.1 - 91.4 %   Platelets 290  150 - 400 K/uL  COMPREHENSIVE METABOLIC PANEL      Result Value Range   Sodium 137  135 - 145 mEq/L   Potassium 3.8  3.5 - 5.1 mEq/L   Chloride 102  96 - 112 mEq/L   CO2 25  19 - 32 mEq/L   Glucose, Bld 130 (*) 70 - 99 mg/dL   BUN 11  6 - 23 mg/dL   Creatinine, Ser 7.82  0.50 - 1.10 mg/dL   Calcium 9.6  8.4 - 95.6 mg/dL   Total Protein 7.2  6.0 - 8.3 g/dL   Albumin 4.1  3.5 - 5.2 g/dL   AST 18  0 - 37 U/L   ALT 10  0 - 35 U/L   Alkaline Phosphatase 59  39 - 117 U/L   Total Bilirubin 0.3  0.3 - 1.2 mg/dL   GFR calc non Af Amer >90  >90 mL/min   GFR calc Af Amer >90  >90 mL/min  ETHANOL      Result Value Range   Alcohol, Ethyl (B) <11  0 - 11 mg/dL  SALICYLATE LEVEL      Result Value Range   Salicylate Lvl <2.0 (*) 2.8 - 20.0 mg/dL  URINE RAPID DRUG SCREEN (HOSP PERFORMED)      Result Value Range   Opiates POSITIVE (*) NONE DETECTED   Cocaine NONE DETECTED  NONE  DETECTED   Benzodiazepines NONE DETECTED  NONE DETECTED   Amphetamines NONE DETECTED  NONE DETECTED  Tetrahydrocannabinol NONE DETECTED  NONE DETECTED   Barbiturates NONE DETECTED  NONE DETECTED  POCT PREGNANCY, URINE      Result Value Range   Preg Test, Ur NEGATIVE  NEGATIVE    No results found. No diagnosis found.  MDM  Exam as above, c/w narcotic w/d, mydriasis, tachycardia, agitation, tachypnea. Given ativan IV and clonidine. Labs unremarkable. Medically clear. D/w ACT team and awaiting dispo. No SI/HI. Pt requesting voluntary rehab. No IVC. Placed on clonidine BID and ativan prn. Care transferred to Dr Lavella Lemons at 0100  I have personally reviewed labs and considered in my MDM. Case d/w Dr Radford Pax  1. Narcotic withdrawal      Audelia Hives, MD 02/09/13 [phone]

## 2013-02-08 NOTE — ED Provider Notes (Signed)
History    CSN: [id number] Arrival date & time 02/08/13  0414  First MD Initiated Contact with Patient 02/08/13 [phone]     Chief Complaint  Patient presents with  . Anxiety   (Consider location/radiation/quality/duration/timing/severity/associated sxs/prior Treatment) Patient is a 32 y.o. female presenting with anxiety. The history is provided by the patient. No language interpreter was used.  Anxiety This is a new problem. The current episode started less than 1 hour ago. The problem occurs constantly. The problem has not changed since onset.Pertinent negatives include no chest pain, no abdominal pain, no headaches and no shortness of breath. Nothing aggravates the symptoms. Nothing relieves the symptoms. She has tried nothing for the symptoms. The treatment provided no relief.   Past Medical History  Diagnosis Date  . Cystic fibrosis   . DDD (degenerative disc disease)   . Thoracic compression fracture   . Back pain, chronic   . Coccygeal fracture   . Ovarian cyst    Past Surgical History  Procedure Laterality Date  . Laparoscopy    . Ovarian cyst removal    . Tubal ligation     Family History  Problem Relation Age of Onset  . Heart failure Mother   . Diabetes Mother   . Hypertension Father   . Heart failure Other   . Diabetes Other   . Cancer Other    History  Substance Use Topics  . Smoking status: Current Every Day Smoker -- 1.00 packs/day for 10 years    Types: Cigarettes  . Smokeless tobacco: Not on file  . Alcohol Use: Yes     Comment: occasional   OB History   Grav Para Term Preterm Abortions TAB SAB Ect Mult Living                 Review of Systems  Respiratory: Negative for shortness of breath.   Cardiovascular: Negative for chest pain.  Gastrointestinal: Negative for abdominal pain.  Neurological: Negative for headaches.  Psychiatric/Behavioral: Negative for suicidal ideas, hallucinations and self-injury. The patient is nervous/anxious.   All  other systems reviewed and are negative.    Allergies  Compazine and Phenergan  Home Medications   Current Outpatient Rx  Name  Route  Sig  Dispense  Refill  . cyclobenzaprine (FLEXERIL) 5 MG tablet   Oral   Take 1 tablet (5 mg total) by mouth 3 (three) times daily as needed for muscle spasms.   30 tablet   0   . diclofenac (CATAFLAM) 50 MG tablet   Oral   Take 1 tablet (50 mg total) by mouth 3 (three) times daily.   15 tablet   0    BP 112/65  Pulse 110  Temp(Src) 98.8 F (37.1 C) (Oral)  Resp 20  SpO2 100% Physical Exam  Constitutional: She is oriented to person, place, and time. She appears well-developed and well-nourished. No distress.  HENT:  Head: Normocephalic and atraumatic.  Mouth/Throat: Oropharynx is clear and moist.  Eyes: Conjunctivae are normal. Pupils are equal, round, and reactive to light.  Neck: Normal range of motion. Neck supple.  Cardiovascular: Normal rate, regular rhythm and intact distal pulses.   Pulmonary/Chest: Effort normal and breath sounds normal. No respiratory distress. She has no wheezes. She has no rales. She exhibits no tenderness.  Abdominal: Soft. Bowel sounds are normal. There is no tenderness. There is no rebound and no guarding.  Musculoskeletal: Normal range of motion. She exhibits no edema and no tenderness.  Neurological: She  is alert and oriented to person, place, and time.  Skin: Skin is warm and dry.  Psychiatric: Her mood appears anxious. She is not actively hallucinating. She does not exhibit a depressed mood.    ED Course  Procedures (including critical care time) Labs Reviewed  URINALYSIS, ROUTINE W REFLEX MICROSCOPIC  URINE RAPID DRUG SCREEN (HOSP PERFORMED)   No results found. No diagnosis found. PERC negative and wells 0  No SI no HI no AH nor VH MDM  suspect ingestion as source of patient's behavior, and that patient wants something to ease transition but is denying ingestion. Patient states things are  moving all over her but benadryl will not help her. Treated with ativan states she wants to be "put to sleep".  EDP explained that we cannot put people to sleep.  When no one is in the room patient is calm.  Is safe for discharge at this time  Jalaina Salyers K Wilbon Obenchain-Rasch, MD 02/08/13 0600

## 2013-02-09 ENCOUNTER — Emergency Department (HOSPITAL_COMMUNITY)
Admission: EM | Admit: 2013-02-09 | Discharge: 2013-02-09 | Discharge status: Against Medical Advice | Payer: Medicaid Other | Attending: Emergency Medicine | Admitting: Emergency Medicine

## 2013-02-09 ENCOUNTER — Encounter (HOSPITAL_COMMUNITY): Payer: Self-pay | Admitting: *Deleted

## 2013-02-09 ENCOUNTER — Encounter (HOSPITAL_COMMUNITY): Payer: Self-pay | Admitting: Emergency Medicine

## 2013-02-09 DIAGNOSIS — F1110 Opioid abuse, uncomplicated: Secondary | ICD-10-CM | POA: Insufficient documentation

## 2013-02-09 DIAGNOSIS — G8929 Other chronic pain: Secondary | ICD-10-CM | POA: Insufficient documentation

## 2013-02-09 DIAGNOSIS — Z888 Allergy status to other drugs, medicaments and biological substances status: Secondary | ICD-10-CM | POA: Insufficient documentation

## 2013-02-09 DIAGNOSIS — F17200 Nicotine dependence, unspecified, uncomplicated: Secondary | ICD-10-CM | POA: Insufficient documentation

## 2013-02-09 DIAGNOSIS — Z8742 Personal history of other diseases of the female genital tract: Secondary | ICD-10-CM | POA: Insufficient documentation

## 2013-02-09 DIAGNOSIS — IMO0002 Reserved for concepts with insufficient information to code with codable children: Secondary | ICD-10-CM | POA: Insufficient documentation

## 2013-02-09 DIAGNOSIS — M549 Dorsalgia, unspecified: Secondary | ICD-10-CM | POA: Insufficient documentation

## 2013-02-09 DIAGNOSIS — Z8739 Personal history of other diseases of the musculoskeletal system and connective tissue: Secondary | ICD-10-CM | POA: Insufficient documentation

## 2013-02-09 DIAGNOSIS — Z79899 Other long term (current) drug therapy: Secondary | ICD-10-CM | POA: Insufficient documentation

## 2013-02-09 MED ORDER — CLONIDINE HCL 0.1 MG PO TABS
0.1000 mg | ORAL_TABLET | Freq: Four times a day (QID) | ORAL | Status: DC
  Administered 2013-02-09: 0.1 mg via ORAL
  Filled 2013-02-09: qty 1

## 2013-02-09 MED ORDER — LORAZEPAM 1 MG PO TABS
1.0000 mg | ORAL_TABLET | Freq: Once | ORAL | Status: AC
  Administered 2013-02-09: 1 mg via ORAL
  Filled 2013-02-09: qty 1

## 2013-02-09 NOTE — ED Provider Notes (Signed)
History    CSN: [id number] Arrival date & time 02/09/13  1259  First MD Initiated Contact with Patient 02/09/13 1334     Chief Complaint  Patient presents with  . Addiction Problem   (Consider location/radiation/quality/duration/timing/severity/associated sxs/prior Treatment) HPI Comments: 32 year old female, history of opiate abuse, was just in the hospital less than one hour ago requesting help with this but decided to leave and pursue outpatient therapy. On leaving the patient was told that she had no place to live with because she did not get detox at her boyfriend's father would not let her live in his house.  She then promptly return for further evaluation and detox placement.  The history is provided by the patient and a friend.   Past Medical History  Diagnosis Date  . Cystic fibrosis   . DDD (degenerative disc disease)   . Thoracic compression fracture   . Back pain, chronic   . Coccygeal fracture   . Ovarian cyst   . Substance abuse    Past Surgical History  Procedure Laterality Date  . Laparoscopy    . Ovarian cyst removal    . Tubal ligation     Family History  Problem Relation Age of Onset  . Heart failure Mother   . Diabetes Mother   . Hypertension Father   . Heart failure Other   . Diabetes Other   . Cancer Other    History  Substance Use Topics  . Smoking status: Current Every Day Smoker -- 1.00 packs/day for 10 years    Types: Cigarettes  . Smokeless tobacco: Not on file  . Alcohol Use: Yes     Comment: occasional   OB History   Grav Para Term Preterm Abortions TAB SAB Ect Mult Living                 Review of Systems  All other systems reviewed and are negative.    Allergies  Compazine and Phenergan  Home Medications   Current Outpatient Rx  Name  Route  Sig  Dispense  Refill  . acetaminophen (TYLENOL) 500 MG tablet   Oral   Take 500 mg by mouth every 6 (six) hours as needed for pain.         . nitrofurantoin,  macrocrystal-monohydrate, (MACROBID) 100 MG capsule   Oral   Take 1 capsule (100 mg total) by mouth 2 (two) times daily. X 7 days   14 capsule   0   . traMADol (ULTRAM) 50 MG tablet   Oral   Take 50 mg by mouth every 6 (six) hours as needed for pain.          BP 99/58  Pulse 98  Temp(Src) 99.2 F (37.3 C) (Oral)  Resp 18  Ht 5\' 5"  (1.651 m)  Wt 130 lb (58.968 kg)  BMI 21.63 kg/m2  SpO2 99%  LMP 02/01/2013 Physical Exam  Nursing note and vitals reviewed. Constitutional: She appears well-developed and well-nourished. No distress.  HENT:  Head: Normocephalic and atraumatic.  Mouth/Throat: Oropharynx is clear and moist. No oropharyngeal exudate.  Eyes: Conjunctivae and EOM are normal. Pupils are equal, round, and reactive to light. Right eye exhibits no discharge. Left eye exhibits no discharge. No scleral icterus.  Neck: Normal range of motion. Neck supple. No JVD present. No thyromegaly present.  Cardiovascular: Normal rate, regular rhythm, normal heart sounds and intact distal pulses.  Exam reveals no gallop and no friction rub.   No murmur heard. Pulmonary/Chest: Effort  normal and breath sounds normal. No respiratory distress. She has no wheezes. She has no rales.  Abdominal: Soft. Bowel sounds are normal. She exhibits no distension and no mass. There is no tenderness.  Musculoskeletal: Normal range of motion. She exhibits no edema and no tenderness.  Lymphadenopathy:    She has no cervical adenopathy.  Neurological: She is alert. Coordination normal.  Skin: Skin is warm and dry. No rash noted. No erythema.  Psychiatric:  Tearful    ED Course  Procedures (including critical care time) Labs Reviewed - No data to display No results found. 1. Opiate abuse, continuous     MDM  The patient possibly has some secondary gain related to leaving the hospital without detox, she does not appear to be in acute danger to herself or others, both her and her boyfriend checked at  the same time within a short time of being discharged, less than one hour   Pt has eloped without informing staff confirming that she truly does not want help with her opiate abuse.  Vida Roller, MD 02/09/13 [phone]

## 2013-02-09 NOTE — ED Provider Notes (Signed)
The patient has chosen to do outpatient treatment for her opiate abuse rather than inpatient, she will be safely discharged at this time.  Vida Roller, MD 02/09/13 [phone]

## 2013-02-09 NOTE — ED Notes (Signed)
Pt requested and is given apple juice.

## 2013-02-09 NOTE — ED Notes (Signed)
Pt temp not taken due to pt having something cold to drink.

## 2013-02-09 NOTE — ED Notes (Signed)
Patient was seen by provider and patient and boyfriend decided they were not going to stay. They both left AMA.

## 2013-02-09 NOTE — ED Provider Notes (Signed)
I saw and evaluated the patient, reviewed the resident's note and I agree with the findings and plan.   .Face to face Exam:  General:  Awake HEENT:  Atraumatic Resp:  Normal effort Abd:  Nondistended Neuro:No focal weakness    Nelia Shi, MD 02/09/13 1520

## 2013-02-09 NOTE — [facility] Counselor (Signed)
Pt is provided outpatient opioid treatment information for [place]. Pt reports that she cannot "be away from my children more than a couple days. Can we (her boyfriend in [facility] [address]) stay here another night?" Clinical research associate and doctor Hyacinth Meeker agreed that outpatient services which provided Suboxone is best for the pt. Denice Bors, AADC 02/09/2013 11:53 AM

## 2013-02-09 NOTE — BH Assessment (Signed)
[facility] Assessment Progress Note      Referral faxed to [facility] and [place].

## 2013-02-09 NOTE — ED Notes (Signed)
Pt ask for meds and something to drink. RN checking on meds and pt given apple juice on ice.

## 2013-02-09 NOTE — ED Notes (Signed)
Pt here sts she just left and now requesting inpatient detox from opiods; pt sts last use was Friday am

## 2013-02-10 LAB — URINE CULTURE: Colony Count: >=100000

## 2013-02-11 ENCOUNTER — Telehealth (HOSPITAL_COMMUNITY): Payer: Self-pay | Admitting: Emergency Medicine

## 2013-02-11 NOTE — Telephone Encounter (Signed)
Post ED Visit - Positive Culture Follow-up  Culture report reviewed by antimicrobial stewardship pharmacist: [x]  Wes Dulaney, Pharm.D., BCPS []  Celedonio Miyamoto, Pharm.D., BCPS []  Georgina Pillion, Pharm.D., BCPS []  Echo, [place].D., BCPS, AAHIVP []  Estella Husk, Pharm.D., BCPS, AAHIVP  Positive urine culture Treated with nitrofurantoin, organism sensitive to the same and no further patient follow-up is required at this time.  Ashley Jacobs 02/11/2013, 10:37 AM

## 2013-04-04 ENCOUNTER — Emergency Department (HOSPITAL_COMMUNITY)
Admission: EM | Admit: 2013-04-04 | Discharge: 2013-04-04 | Disposition: A | Discharge status: Home / Self Care | Payer: Medicaid Other | Source: Home / Self Care | Attending: Family Medicine | Admitting: Family Medicine

## 2013-04-04 ENCOUNTER — Encounter (HOSPITAL_COMMUNITY): Payer: Self-pay | Admitting: Emergency Medicine

## 2013-04-04 DIAGNOSIS — J302 Other seasonal allergic rhinitis: Secondary | ICD-10-CM

## 2013-04-04 DIAGNOSIS — J309 Allergic rhinitis, unspecified: Secondary | ICD-10-CM

## 2013-04-04 DIAGNOSIS — F411 Generalized anxiety disorder: Secondary | ICD-10-CM

## 2013-04-04 MED ORDER — GI COCKTAIL ~~LOC~~
ORAL | Status: AC
  Filled 2013-04-04: qty 30

## 2013-04-04 MED ORDER — GI COCKTAIL ~~LOC~~
30.0000 mL | Freq: Once | ORAL | Status: AC
  Administered 2013-04-04: 30 mL via ORAL

## 2013-04-04 MED ORDER — FLUTICASONE PROPIONATE 50 MCG/ACT NA SUSP: 2.0000 | Freq: Two times a day (BID) | NASAL | Status: DC

## 2013-04-04 MED ORDER — FEXOFENADINE HCL 180 MG PO TABS: 180.0000 mg | ORAL_TABLET | Freq: Every day | ORAL | Status: DC

## 2013-04-04 MED ORDER — HYDROXYZINE HCL 50 MG/ML IM SOLN: 50.0000 mg | Freq: Once | INTRAMUSCULAR | Status: DC

## 2013-04-04 MED ORDER — HYDROXYZINE HCL 50 MG PO TABS
50.0000 mg | ORAL_TABLET | Freq: Once | ORAL | Status: AC
  Administered 2013-04-04: 50 mg via ORAL

## 2013-04-04 MED ORDER — HYDROXYZINE HCL 50 MG PO TABS: 50.0000 mg | ORAL_TABLET | Freq: Four times a day (QID) | ORAL | Status: DC | PRN

## 2013-04-04 NOTE — ED Notes (Signed)
Sent front staff to pharmacy to p/u hydroxyzine 50mg  tab... Ok per dr. Lilla Shook since inj is in back order.

## 2013-04-04 NOTE — ED Notes (Signed)
Pt c/o panic attack onset 2 days Sxs include: chest tightness, difficult breathing, dizziness, numbness of bilateral hands She is on methadone and can't take any narcotics  Alert w/no signs of acute distress.

## 2013-04-04 NOTE — ED Provider Notes (Signed)
CSN: [id number]     Arrival date & time 04/04/13  1810 History   First MD Initiated Contact with Patient 04/04/13 1937     Chief Complaint  Patient presents with  . Anxiety   (Consider location/radiation/quality/duration/timing/severity/associated sxs/prior Treatment) Patient is a 32 y.o. female presenting with anxiety. The history is provided by the patient.  Anxiety This is a chronic problem. The current episode started 2 days ago. The problem has been gradually worsening. Associated symptoms include shortness of breath. Pertinent negatives include no chest pain and no abdominal pain.    Past Medical History  Diagnosis Date  . Cystic fibrosis   . DDD (degenerative disc disease)   . Thoracic compression fracture   . Back pain, chronic   . Coccygeal fracture   . Ovarian cyst   . Substance abuse    Past Surgical History  Procedure Laterality Date  . Laparoscopy    . Ovarian cyst removal    . Tubal ligation     Family History  Problem Relation Age of Onset  . Heart failure Mother   . Diabetes Mother   . Hypertension Father   . Heart failure Other   . Diabetes Other   . Cancer Other    History  Substance Use Topics  . Smoking status: Current Every Day Smoker -- 1.00 packs/day for 10 years    Types: Cigarettes  . Smokeless tobacco: Not on file  . Alcohol Use: Yes     Comment: occasional   OB History   Grav Para Term Preterm Abortions TAB SAB Ect Mult Living                 Review of Systems  Constitutional: Negative.   HENT: Positive for congestion, rhinorrhea and postnasal drip.   Respiratory: Positive for cough and shortness of breath.   Cardiovascular: Positive for palpitations. Negative for chest pain.  Gastrointestinal: Negative.  Negative for abdominal pain.  Psychiatric/Behavioral: Positive for agitation.    Allergies  Compazine and Phenergan  Home Medications   Current Outpatient Rx  Name  Route  Sig  Dispense  Refill  . METHADONE HCL PO    Oral   Take by mouth.         Marland Kitchen acetaminophen (TYLENOL) 500 MG tablet   Oral   Take 500 mg by mouth every 6 (six) hours as needed for pain.         . fexofenadine (ALLEGRA) 180 MG tablet   Oral   Take 1 tablet (180 mg total) by mouth daily. For allergy congestion   30 tablet   1   . fluticasone (FLONASE) 50 MCG/ACT nasal spray   Nasal   Place 2 sprays into the nose 2 (two) times daily.   1 g   2   . hydrOXYzine (ATARAX/VISTARIL) 50 MG tablet   Oral   Take 1 tablet (50 mg total) by mouth every 6 (six) hours as needed for anxiety.   20 tablet   0   . nitrofurantoin, macrocrystal-monohydrate, (MACROBID) 100 MG capsule   Oral   Take 1 capsule (100 mg total) by mouth 2 (two) times daily. X 7 days   14 capsule   0   . traMADol (ULTRAM) 50 MG tablet   Oral   Take 50 mg by mouth every 6 (six) hours as needed for pain.          LMP 02/18/2013 Physical Exam  Nursing note and vitals reviewed. Constitutional: She is oriented to  person, place, and time. She appears well-developed and well-nourished. She appears distressed.  HENT:  Head: Normocephalic.  Mouth/Throat: Oropharynx is clear and moist.  Eyes: Conjunctivae are normal. Pupils are equal, round, and reactive to light.  Neck: Normal range of motion. Neck supple.  Cardiovascular: Normal rate, regular rhythm, normal heart sounds and intact distal pulses.   Pulmonary/Chest: Effort normal and breath sounds normal.  Lymphadenopathy:    She has no cervical adenopathy.  Neurological: She is alert and oriented to person, place, and time.  Skin: Skin is warm and dry.    ED Course  Procedures (including critical care time) Labs Review Labs Reviewed - No data to display Imaging Review No results found.  MDM      Linna Hoff, MD 04/04/13 [phone]

## 2013-05-01 ENCOUNTER — Encounter (HOSPITAL_COMMUNITY): Payer: Self-pay | Admitting: Emergency Medicine

## 2013-05-01 ENCOUNTER — Emergency Department (HOSPITAL_COMMUNITY)
Admission: EM | Admit: 2013-05-01 | Discharge: 2013-05-01 | Disposition: A | Discharge status: Home / Self Care | Payer: Medicaid Other | Source: Home / Self Care

## 2013-05-01 DIAGNOSIS — F449 Dissociative and conversion disorder, unspecified: Secondary | ICD-10-CM

## 2013-05-01 DIAGNOSIS — F458 Other somatoform disorders: Secondary | ICD-10-CM

## 2013-05-01 DIAGNOSIS — K219 Gastro-esophageal reflux disease without esophagitis: Secondary | ICD-10-CM

## 2013-05-01 DIAGNOSIS — F419 Anxiety disorder, unspecified: Secondary | ICD-10-CM

## 2013-05-01 DIAGNOSIS — R09A2 Foreign body sensation, throat: Secondary | ICD-10-CM

## 2013-05-01 DIAGNOSIS — R0982 Postnasal drip: Secondary | ICD-10-CM

## 2013-05-01 DIAGNOSIS — F411 Generalized anxiety disorder: Secondary | ICD-10-CM

## 2013-05-01 DIAGNOSIS — N644 Mastodynia: Secondary | ICD-10-CM

## 2013-05-01 MED ORDER — PANTOPRAZOLE SODIUM 40 MG PO TBEC: 40.0000 mg | DELAYED_RELEASE_TABLET | Freq: Every day | ORAL | Status: DC

## 2013-05-01 MED ORDER — FAMOTIDINE 20 MG PO TABS: 20.0000 mg | ORAL_TABLET | Freq: Two times a day (BID) | ORAL | Status: DC

## 2013-05-01 NOTE — ED Provider Notes (Signed)
CSN: [id number]     Arrival date & time 05/01/13  1247 History   First MD Initiated Contact with Patient 05/01/13 1411     Chief Complaint  Patient presents with  . Gastrophageal Reflux    severe acid reflux x 1 month. sob.   . Breast Pain    left breast pain   (Consider location/radiation/quality/duration/timing/severity/associated sxs/prior Treatment) HPI Comments: 32 year old female presents with exacerbating symptoms of GERD. She is complaining of heartburn and regurgitation. Especially after drinking coffee. Is also worse with bending forward. She is complaining of anxiety associated with a choking sensation in the throat. She has a long history of anxiety as well as opiate abuse. She is currently being treated with methadone from the behavioral clinic.  Her second primary complaint is that of left breast pain for 2 years. It occurs on rare occasion is sharp and fleeting. She denies having breast pain  today. She states her anxiety and her upset and in occasionally will have the sharp pain. Denies redness or tenderness of the breast.    Past Medical History  Diagnosis Date  . Cystic fibrosis   . DDD (degenerative disc disease)   . Thoracic compression fracture   . Back pain, chronic   . Coccygeal fracture   . Ovarian cyst   . Substance abuse    Past Surgical History  Procedure Laterality Date  . Laparoscopy    . Ovarian cyst removal    . Tubal ligation     Family History  Problem Relation Age of Onset  . Heart failure Mother   . Diabetes Mother   . Hypertension Father   . Heart failure Other   . Diabetes Other   . Cancer Other    History  Substance Use Topics  . Smoking status: Current Every Day Smoker -- 1.00 packs/day for 10 years    Types: Cigarettes  . Smokeless tobacco: Not on file  . Alcohol Use: Yes     Comment: occasional   OB History   Grav Para Term Preterm Abortions TAB SAB Ect Mult Living                 Review of Systems  Constitutional:  Negative.   HENT: Positive for postnasal drip and rhinorrhea.   Respiratory: Positive for choking. Negative for cough, chest tightness, shortness of breath, wheezing and stridor.   Cardiovascular: Negative for chest pain and leg swelling.  Gastrointestinal: Negative for abdominal pain and abdominal distention.       Reflux   Genitourinary: Negative.   Skin: Negative.   Neurological: Negative.   Psychiatric/Behavioral: Positive for sleep disturbance. The patient is nervous/anxious.     Allergies  Compazine and Phenergan  Home Medications   Current Outpatient Rx  Name  Route  Sig  Dispense  Refill  . DiphenhydrAMINE HCl (BENADRYL ALLERGY PO)   Oral   Take by mouth.         . Omeprazole (PRILOSEC PO)   Oral   Take by mouth.         Marland Kitchen acetaminophen (TYLENOL) 500 MG tablet   Oral   Take 500 mg by mouth every 6 (six) hours as needed for pain.         . famotidine (PEPCID) 20 MG tablet   Oral   Take 1 tablet (20 mg total) by mouth 2 (two) times daily.   60 tablet   0   . fexofenadine (ALLEGRA) 180 MG tablet   Oral  Take 1 tablet (180 mg total) by mouth daily. For allergy congestion   30 tablet   1   . fluticasone (FLONASE) 50 MCG/ACT nasal spray   Nasal   Place 2 sprays into the nose 2 (two) times daily.   1 g   2   . hydrOXYzine (ATARAX/VISTARIL) 50 MG tablet   Oral   Take 1 tablet (50 mg total) by mouth every 6 (six) hours as needed for anxiety.   20 tablet   0   . METHADONE HCL PO   Oral   Take by mouth.         . nitrofurantoin, macrocrystal-monohydrate, (MACROBID) 100 MG capsule   Oral   Take 1 capsule (100 mg total) by mouth 2 (two) times daily. X 7 days   14 capsule   0   . pantoprazole (PROTONIX) 40 MG tablet   Oral   Take 1 tablet (40 mg total) by mouth daily.   30 tablet   0   . traMADol (ULTRAM) 50 MG tablet   Oral   Take 50 mg by mouth every 6 (six) hours as needed for pain.          BP 114/74  Pulse 111  Temp(Src) 98.8 F  (37.1 C) (Oral)  Resp 20  SpO2 100%  LMP 04/03/2013 Physical Exam  Nursing note and vitals reviewed. Constitutional: She is oriented to person, place, and time. She appears well-developed and well-nourished. No distress.  HENT:  Mouth/Throat: Oropharynx is clear and moist. No oropharyngeal exudate.  Eyes: Conjunctivae and EOM are normal.  Neck: Normal range of motion. Neck supple. No tracheal deviation present. No thyromegaly present.  Cardiovascular: Regular rhythm and normal heart sounds.   No murmur heard. Tachycardia  Pulmonary/Chest: Effort normal and breath sounds normal. No respiratory distress. She has no rales. She exhibits no tenderness.  Rare bilateral distant expiratory wheeze.  With EMT chaperone. Left breast examination reveals no lumps, bumps or areas of tenderness. The breasts are symmetric. Nipples everted. No tenderness, bleeding or discharge from the nipple. No dimpling or discoloration. No tenderness to the underlying chest Zazueta. No regional lymphadenopathy.  Musculoskeletal: She exhibits no edema and no tenderness.  Lymphadenopathy:    She has no cervical adenopathy.  Neurological: She is alert and oriented to person, place, and time. She exhibits normal muscle tone.  Skin: Skin is warm and dry.  Psychiatric: Thought content normal. Her mood appears anxious. Her speech is rapid and/or pressured. She is hyperactive.    ED Course  Procedures (including critical care time) Labs Review Labs Reviewed - No data to display Imaging Review No results found.  MDM   1. Anxiety disorder   2. GERD (gastroesophageal reflux disease)   3. Globus hystericus   4. PND (post-nasal drip)   5. Breast pain, left      Reassurance. Stop the Prilosec and start Protonix 40 mg daily. Also start Pepcid 20 mg twice a day stop drinking so much coffee. No acidic greasy foods. Followup with your primary care doctor in reference to measuring thyroid levels and for possibly  containing a mammogram. He may also need to have an H. pylori drawn. Follow up with your behavioral health provider to discuss anxiety associated with methadone.   Hayden Rasmussen, NP 05/01/13 1511

## 2013-05-01 NOTE — ED Provider Notes (Signed)
Medical screening examination/treatment/procedure(s) were performed by resident physician or non-physician practitioner and as supervising physician I was immediately available for consultation/collaboration.   Barkley Bruns MD.   Linna Hoff, MD 05/01/13 2117

## 2013-05-01 NOTE — ED Notes (Signed)
C/o severe acid reflux. No relief with prilosec/malox x 1 month. States feels like it's hard to breath. Having panic and anxiety attacks.   Left breast pain that is sharp and comes/goes.  Denies injury.

## 2013-08-21 ENCOUNTER — Encounter (HOSPITAL_COMMUNITY): Payer: Self-pay | Admitting: Emergency Medicine

## 2013-08-21 ENCOUNTER — Emergency Department (HOSPITAL_COMMUNITY)
Admission: EM | Admit: 2013-08-21 | Discharge: 2013-08-22 | Disposition: A | Discharge status: Home / Self Care | Payer: Medicaid Other | Attending: Emergency Medicine | Admitting: Emergency Medicine

## 2013-08-21 DIAGNOSIS — M549 Dorsalgia, unspecified: Secondary | ICD-10-CM | POA: Insufficient documentation

## 2013-08-21 DIAGNOSIS — Z3202 Encounter for pregnancy test, result negative: Secondary | ICD-10-CM | POA: Insufficient documentation

## 2013-08-21 DIAGNOSIS — F1120 Opioid dependence, uncomplicated: Secondary | ICD-10-CM | POA: Insufficient documentation

## 2013-08-21 DIAGNOSIS — Z79899 Other long term (current) drug therapy: Secondary | ICD-10-CM | POA: Insufficient documentation

## 2013-08-21 DIAGNOSIS — F17200 Nicotine dependence, unspecified, uncomplicated: Secondary | ICD-10-CM | POA: Insufficient documentation

## 2013-08-21 DIAGNOSIS — IMO0002 Reserved for concepts with insufficient information to code with codable children: Secondary | ICD-10-CM | POA: Insufficient documentation

## 2013-08-21 DIAGNOSIS — F411 Generalized anxiety disorder: Secondary | ICD-10-CM | POA: Insufficient documentation

## 2013-08-21 DIAGNOSIS — J3489 Other specified disorders of nose and nasal sinuses: Secondary | ICD-10-CM | POA: Insufficient documentation

## 2013-08-21 DIAGNOSIS — R110 Nausea: Secondary | ICD-10-CM | POA: Insufficient documentation

## 2013-08-21 DIAGNOSIS — Z8742 Personal history of other diseases of the female genital tract: Secondary | ICD-10-CM | POA: Insufficient documentation

## 2013-08-21 DIAGNOSIS — Z8639 Personal history of other endocrine, nutritional and metabolic disease: Secondary | ICD-10-CM | POA: Insufficient documentation

## 2013-08-21 DIAGNOSIS — Z862 Personal history of diseases of the blood and blood-forming organs and certain disorders involving the immune mechanism: Secondary | ICD-10-CM | POA: Insufficient documentation

## 2013-08-21 DIAGNOSIS — G8929 Other chronic pain: Secondary | ICD-10-CM | POA: Insufficient documentation

## 2013-08-21 DIAGNOSIS — Z8781 Personal history of (healed) traumatic fracture: Secondary | ICD-10-CM | POA: Insufficient documentation

## 2013-08-21 LAB — RAPID URINE DRUG SCREEN, HOSP PERFORMED
Amphetamines: NOT DETECTED
Barbiturates: NOT DETECTED
Benzodiazepines: POSITIVE — AB
Cocaine: POSITIVE — AB
Opiates: POSITIVE — AB
TETRAHYDROCANNABINOL: POSITIVE — AB

## 2013-08-21 LAB — COMPREHENSIVE METABOLIC PANEL
ALT: 13 U/L (ref 0–35)
AST: 16 U/L (ref 0–37)
Albumin: 4.7 g/dL (ref 3.5–5.2)
Alkaline Phosphatase: 69 U/L (ref 39–117)
BUN: 10 mg/dL (ref 6–23)
CO2: 26 meq/L (ref 19–32)
CREATININE: 0.65 mg/dL (ref 0.50–1.10)
Calcium: 9.7 mg/dL (ref 8.4–10.5)
Chloride: 100 mEq/L (ref 96–112)
GFR calc non Af Amer: >90 mL/min (ref >90)
Glucose, Bld: 93 mg/dL (ref 70–99)
Potassium: 4 mEq/L (ref 3.7–5.3)
Sodium: 141 mEq/L (ref 137–147)
Total Bilirubin: 0.2 mg/dL — ABNORMAL LOW (ref 0.3–1.2)
Total Protein: 8.4 g/dL — ABNORMAL HIGH (ref 6.0–8.3)

## 2013-08-21 LAB — SALICYLATE LEVEL: Salicylate Lvl: <2 mg/dL — ABNORMAL LOW (ref 2.8–20.0)

## 2013-08-21 LAB — CBC
HCT: 43.6 % (ref 36.0–46.0)
HEMOGLOBIN: 15.6 g/dL — AB (ref 12.0–15.0)
MCH: 34.1 pg — AB (ref 26.0–34.0)
MCHC: 35.8 g/dL (ref 30.0–36.0)
MCV: 95.4 fL (ref 78.0–100.0)
Platelets: 340 10*3/uL (ref 150–400)
RBC: 4.57 MIL/uL (ref 3.87–5.11)
RDW: 12.7 % (ref 11.5–15.5)
WBC: 7.6 10*3/uL (ref 4.0–10.5)

## 2013-08-21 LAB — ACETAMINOPHEN LEVEL

## 2013-08-21 LAB — ETHANOL: Alcohol, Ethyl (B): <11 mg/dL (ref 0–11)

## 2013-08-21 LAB — POCT PREGNANCY, URINE: Preg Test, Ur: NEGATIVE

## 2013-08-21 MED ORDER — HYDROXYZINE HCL 25 MG PO TABS
25.0000 mg | ORAL_TABLET | Freq: Four times a day (QID) | ORAL | Status: DC | PRN
  Administered 2013-08-22 (×2): 25 mg via ORAL
  Filled 2013-08-21 (×2): qty 1

## 2013-08-21 MED ORDER — LORAZEPAM 1 MG PO TABS
1.0000 mg | ORAL_TABLET | Freq: Once | ORAL | Status: AC
  Administered 2013-08-21: 1 mg via ORAL
  Filled 2013-08-21: qty 1

## 2013-08-21 MED ORDER — CLONIDINE HCL 0.1 MG PO TABS
0.1000 mg | ORAL_TABLET | Freq: Once | ORAL | Status: AC
  Administered 2013-08-21: 0.1 mg via ORAL
  Filled 2013-08-21: qty 1

## 2013-08-21 MED ORDER — METHOCARBAMOL 500 MG PO TABS
500.0000 mg | ORAL_TABLET | Freq: Three times a day (TID) | ORAL | Status: DC | PRN
  Administered 2013-08-22: 500 mg via ORAL
  Filled 2013-08-21: qty 1

## 2013-08-21 MED ORDER — DICYCLOMINE HCL 20 MG PO TABS: 20.0000 mg | ORAL_TABLET | Freq: Four times a day (QID) | ORAL | Status: DC | PRN

## 2013-08-21 MED ORDER — CLONIDINE HCL 0.1 MG PO TABS: 0.1000 mg | ORAL_TABLET | Freq: Every day | ORAL | Status: DC

## 2013-08-21 MED ORDER — ONDANSETRON 4 MG PO TBDP
8.0000 mg | ORAL_TABLET | Freq: Once | ORAL | Status: AC
  Administered 2013-08-21: 8 mg via ORAL
  Filled 2013-08-21: qty 2

## 2013-08-21 MED ORDER — ONDANSETRON 4 MG PO TBDP: 4.0000 mg | ORAL_TABLET | Freq: Four times a day (QID) | ORAL | Status: DC | PRN

## 2013-08-21 MED ORDER — NAPROXEN 250 MG PO TABS
500.0000 mg | ORAL_TABLET | Freq: Two times a day (BID) | ORAL | Status: DC | PRN
  Administered 2013-08-22: 500 mg via ORAL
  Filled 2013-08-21: qty 2

## 2013-08-21 MED ORDER — CLONIDINE HCL 0.1 MG PO TABS: 0.1000 mg | ORAL_TABLET | ORAL | Status: DC

## 2013-08-21 MED ORDER — CLONIDINE HCL 0.1 MG PO TABS
0.1000 mg | ORAL_TABLET | Freq: Four times a day (QID) | ORAL | Status: DC
  Administered 2013-08-21 – 2013-08-22 (×2): 0.1 mg via ORAL
  Filled 2013-08-21 (×2): qty 1

## 2013-08-21 MED ORDER — LOPERAMIDE HCL 2 MG PO CAPS: 2.0000 mg | ORAL_CAPSULE | ORAL | Status: DC | PRN

## 2013-08-21 NOTE — ED Notes (Signed)
Kathline MagicP. Dammen PA at bedside evaluating pt.

## 2013-08-21 NOTE — ED Notes (Signed)
Monitor being placed in room for TTS at this time.

## 2013-08-21 NOTE — ED Provider Notes (Signed)
Medical screening examination/treatment/procedure(s) were performed by non-physician practitioner and as supervising physician I was immediately available for consultation/collaboration.  EKG Interpretation   None         Gilda Creasehristopher J. Pollina, MD 08/21/13 2308

## 2013-08-21 NOTE — ED Notes (Signed)
telepsych taken to the room.
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
## 2013-08-21 NOTE — ED Notes (Signed)
Pt states she is going through opiate withdrawal.  Last used crushed Morphine pills this morning. States she has been trying to detox for over the last year.  Pt crying.  Denies suicidal and homicidal ideation.

## 2013-08-21 NOTE — ED Provider Notes (Signed)
CSN: [id number]     Arrival date & time 08/21/13  1823 History   First MD Initiated Contact with Patient 08/21/13 2009     Chief Complaint  Patient presents with  . detox    HPI  History provided by the patient and significant other. Patient is a 33 year old female with history of narcotic substance abuse who presents with a request for opiate withdrawal detox. Patient reports using narcotic pills since she was 15 after a back injury. She has been through some treatment facilities in the past and was on methadone but could no longer afford her treatments and relapsed. She currently uses high-dose pills and sometimes crutches and supports them. She reports using up to 90 mg of morphine question started throughout the day today. Currently she reports feeling very anxious and sad. She denies any SI or HI. She reports also some nausea. Denies any abdominal pain, diarrhea or constipation symptoms. No chest pain or shortness of breath. No recent fever, chills or sweats.    Past Medical History  Diagnosis Date  . Cystic fibrosis   . DDD (degenerative disc disease)   . Thoracic compression fracture   . Back pain, chronic   . Coccygeal fracture   . Ovarian cyst   . Substance abuse    Past Surgical History  Procedure Laterality Date  . Laparoscopy    . Ovarian cyst removal    . Tubal ligation     Family History  Problem Relation Age of Onset  . Heart failure Mother   . Diabetes Mother   . Hypertension Father   . Heart failure Other   . Diabetes Other   . Cancer Other    History  Substance Use Topics  . Smoking status: Current Every Day Smoker -- 1.00 packs/day for 10 years    Types: Cigarettes  . Smokeless tobacco: Not on file  . Alcohol Use: Yes     Comment: occasional   OB History   Grav Para Term Preterm Abortions TAB SAB Ect Mult Living                 Review of Systems  Constitutional: Negative for fever, chills and diaphoresis.  HENT: Positive for rhinorrhea.    Respiratory: Negative for cough.   Gastrointestinal: Positive for nausea. Negative for vomiting and abdominal pain.  Genitourinary: Negative for dysuria, frequency, hematuria and flank pain.  All other systems reviewed and are negative.    Allergies  Compazine and Phenergan  Home Medications   Current Outpatient Rx  Name  Route  Sig  Dispense  Refill  . acetaminophen (TYLENOL) 500 MG tablet   Oral   Take 500 mg by mouth every 6 (six) hours as needed for pain.         . DiphenhydrAMINE HCl (BENADRYL ALLERGY PO)   Oral   Take by mouth.         . METHADONE HCL PO   Oral   Take by mouth.         . Omeprazole (PRILOSEC PO)   Oral   Take 20 mg by mouth every morning.          . pantoprazole (PROTONIX) 40 MG tablet   Oral   Take 1 tablet (40 mg total) by mouth daily.   30 tablet   0   . traMADol (ULTRAM) 50 MG tablet   Oral   Take 50 mg by mouth every 6 (six) hours as needed for pain.  BP 126/90  Pulse 120  Temp(Src) 98.1 F (36.7 C) (Oral)  Resp 20  SpO2 98%  LMP 08/14/2013 Physical Exam  Nursing note and vitals reviewed. Constitutional: She is oriented to person, place, and time. She appears well-developed and well-nourished. No distress.  HENT:  Head: Normocephalic and atraumatic.  Eyes: Conjunctivae are normal.  Cardiovascular: Normal rate and regular rhythm.   Pulmonary/Chest: Effort normal and breath sounds normal. No respiratory distress. She has no wheezes.  Abdominal: Soft. There is no tenderness. There is no rebound.  Musculoskeletal: Normal range of motion.  Neurological: She is alert and oriented to person, place, and time.  Skin: Skin is warm and dry. No rash noted.  Psychiatric: Her behavior is normal. Her mood appears anxious. She expresses no homicidal and no suicidal ideation.  Tearful    ED Course  Procedures   DIAGNOSTIC STUDIES: Oxygen Saturation is 98% on room air.  COORDINATION OF CARE:  Nursing notes reviewed.  Vital signs reviewed. Initial pt interview and examination performed.   8:41 PM-Discussed work up plan with pt at bedside, which includes . Pt agrees with plan.  Spoke with Tom with [facility].  He will plan to consult patient and evaluate for possible placement at [facility] or [facility].  Psychiatric holding orders in place. Clonidine detox protocol ordered.   Results for orders placed during the hospital encounter of 08/21/13  ACETAMINOPHEN LEVEL      Result Value Range   Acetaminophen (Tylenol), Serum <15.0  10 - 30 ug/mL  CBC      Result Value Range   WBC 7.6  4.0 - 10.5 K/uL   RBC 4.57  3.87 - 5.11 MIL/uL   Hemoglobin 15.6 (*) 12.0 - 15.0 g/dL   HCT 40.9  81.1 - 91.4 %   MCV 95.4  78.0 - 100.0 fL   MCH 34.1 (*) 26.0 - 34.0 pg   MCHC 35.8  30.0 - 36.0 g/dL   RDW 78.2  95.6 - 21.3 %   Platelets 340  150 - 400 K/uL  COMPREHENSIVE METABOLIC PANEL      Result Value Range   Sodium 141  137 - 147 mEq/L   Potassium 4.0  3.7 - 5.3 mEq/L   Chloride 100  96 - 112 mEq/L   CO2 26  19 - 32 mEq/L   Glucose, Bld 93  70 - 99 mg/dL   BUN 10  6 - 23 mg/dL   Creatinine, Ser 0.86  0.50 - 1.10 mg/dL   Calcium 9.7  8.4 - 57.8 mg/dL   Total Protein 8.4 (*) 6.0 - 8.3 g/dL   Albumin 4.7  3.5 - 5.2 g/dL   AST 16  0 - 37 U/L   ALT 13  0 - 35 U/L   Alkaline Phosphatase 69  39 - 117 U/L   Total Bilirubin 0.2 (*) 0.3 - 1.2 mg/dL   GFR calc non Af Amer >90  >90 mL/min   GFR calc Af Amer >90  >90 mL/min  ETHANOL      Result Value Range   Alcohol, Ethyl (B) <11  0 - 11 mg/dL  SALICYLATE LEVEL      Result Value Range   Salicylate Lvl <2.0 (*) 2.8 - 20.0 mg/dL  URINE RAPID DRUG SCREEN (HOSP PERFORMED)      Result Value Range   Opiates POSITIVE (*) NONE DETECTED   Cocaine POSITIVE (*) NONE DETECTED   Benzodiazepines POSITIVE (*) NONE DETECTED   Amphetamines NONE DETECTED  NONE DETECTED  Tetrahydrocannabinol POSITIVE (*) NONE DETECTED   Barbiturates NONE DETECTED  NONE DETECTED  POCT PREGNANCY, URINE       Result Value Range   Preg Test, Ur NEGATIVE  NEGATIVE      MDM   1. Opiate addiction        Angus Seller, [place] 08/21/13 2101

## 2013-08-21 NOTE — BH Assessment (Signed)
[facility] Assessment Progress Note  At 20:58 I spoke to EDP Ivonne AndrewPeter Dammen in anticipation of [facility] assessment scheduled for 21:30.  Doylene Canninghomas Bryceson Grape, MA Triage Specialist 08/21/2013 @ 21:17

## 2013-08-21 NOTE — ED Notes (Signed)
Patient currently talking with counselor currently on monitor for [facility].

## 2013-08-22 NOTE — Discharge Instructions (Signed)
Chemical Dependency °Chemical dependency is an addiction to drugs or alcohol. It is characterized by the repeated behavior of seeking out and using drugs and alcohol despite harmful consequences to the health and safety of ones self and others.  °RISK FACTORS °There are certain situations or behaviors that increase a person's risk for chemical dependency. These include: °· A family history of chemical dependency. °· A history of mental health issues, including depression and anxiety. °· A home environment where drugs and alcohol are easily available to you. °· Drug or alcohol use at a young age. °SYMPTOMS  °The following symptoms can indicate chemical dependency: °· Inability to limit the use of drugs or alcohol. °· Nausea, sweating, shakiness, and anxiety that occurs when alcohol or drugs are not being used. °· An increase in amount of drugs or alcohol that is necessary to get drunk or high. °People who experience these symptoms can assess their use of drugs and alcohol by asking themselves the following questions: °· Have you been told by friends or family that they are worried about your use of alcohol or drugs? °· Do friends and family ever tell you about things you did while drinking alcohol or using drugs that you do not remember? °· Do you lie about using alcohol or drugs or about the amounts you use? °· Do you have difficulty completing daily tasks unless you use alcohol or drugs? °· Is the level of your work or school performance lower because of your drug or alcohol use? °· Do you get sick from using drugs or alcohol but keep using anyway? °· Do you feel uncomfortable in social situations unless you use alcohol or drugs? °· Do you use drugs or alcohol to help forget problems?  °An answer of yes to any of these questions may indicate chemical dependency. Professional evaluation is suggested. °Document Released: 07/05/2001 Document Revised: 10/03/2011 Document Reviewed: 09/16/2010 °ExitCare® Patient  Information ©2014 [employer]. ° °

## 2013-08-22 NOTE — ED Notes (Signed)
Pt sig other is bringing her clothing etc to take with her to [facility]. Will call [facility] when he arrives

## 2013-08-22 NOTE — ED Notes (Signed)
Carol at [facility] notified pt has left [facility] via [facility]

## 2013-08-22 NOTE — Progress Notes (Signed)
Marisa Palmer, MHT referred patient to [facility] for detox. Referral has been faxed inclusive of labs, HPI, [facility] referral form, and assessment note. Writer spoke with Okey Regalarol at [facility] and she reports an acceptance by Dr. Omelia BlackwaterHeaden. Writer informed patient and attending nurse Drinda Buttsnnette who will arrange for transfer via [facility] at 1:00 pm today.

## 2013-08-22 NOTE — ED Provider Notes (Signed)
[facility]  Stable for transfer.  Geoffery Lyonsouglas Zykee Avakian, MD 08/22/13 [phone]

## 2013-08-22 NOTE — ED Notes (Signed)
[facility] been called to transport pt

## 2013-08-22 NOTE — ED Notes (Signed)
Patient placed in paper scrubs at this time and belongings removed from room.

## 2013-08-22 NOTE — ED Notes (Signed)
Patient walked out of room into hall stating "I can't sleep because I am so anxious and I cant keep my legs still-feel like I need to be constantly walking around." Pt instructed to go back to room and that this RN would bring her something for her anxiety. A&Ox4. No acute distress noted at this time.

## 2013-08-22 NOTE — ED Notes (Signed)
Patient has given verbal permission to share any information about her hospital stay to her boyfriend Glade LloydJosh Hadley [phone].

## 2013-08-22 NOTE — ED Notes (Signed)
Patient wanded by security. 

## 2013-08-22 NOTE — ED Notes (Signed)
Pt has been accepted at [facility] in [place]

## 2014-01-10 ENCOUNTER — Emergency Department (HOSPITAL_COMMUNITY)
Admission: EM | Admit: 2014-01-10 | Discharge: 2014-01-10 | Disposition: A | Discharge status: Home / Self Care | Payer: Medicaid Other | Attending: Emergency Medicine | Admitting: Emergency Medicine

## 2014-01-10 ENCOUNTER — Encounter (HOSPITAL_COMMUNITY): Payer: Self-pay | Admitting: Emergency Medicine

## 2014-01-10 ENCOUNTER — Emergency Department (HOSPITAL_COMMUNITY): Payer: Medicaid Other

## 2014-01-10 DIAGNOSIS — Z8781 Personal history of (healed) traumatic fracture: Secondary | ICD-10-CM | POA: Insufficient documentation

## 2014-01-10 DIAGNOSIS — Z862 Personal history of diseases of the blood and blood-forming organs and certain disorders involving the immune mechanism: Secondary | ICD-10-CM | POA: Insufficient documentation

## 2014-01-10 DIAGNOSIS — Z8742 Personal history of other diseases of the female genital tract: Secondary | ICD-10-CM | POA: Insufficient documentation

## 2014-01-10 DIAGNOSIS — Y9389 Activity, other specified: Secondary | ICD-10-CM | POA: Insufficient documentation

## 2014-01-10 DIAGNOSIS — F17200 Nicotine dependence, unspecified, uncomplicated: Secondary | ICD-10-CM | POA: Insufficient documentation

## 2014-01-10 DIAGNOSIS — Z79899 Other long term (current) drug therapy: Secondary | ICD-10-CM | POA: Insufficient documentation

## 2014-01-10 DIAGNOSIS — Y9289 Other specified places as the place of occurrence of the external cause: Secondary | ICD-10-CM | POA: Insufficient documentation

## 2014-01-10 DIAGNOSIS — G8929 Other chronic pain: Secondary | ICD-10-CM | POA: Insufficient documentation

## 2014-01-10 DIAGNOSIS — IMO0002 Reserved for concepts with insufficient information to code with codable children: Secondary | ICD-10-CM | POA: Insufficient documentation

## 2014-01-10 DIAGNOSIS — S139XXA Sprain of joints and ligaments of unspecified parts of neck, initial encounter: Secondary | ICD-10-CM | POA: Insufficient documentation

## 2014-01-10 DIAGNOSIS — Z8739 Personal history of other diseases of the musculoskeletal system and connective tissue: Secondary | ICD-10-CM | POA: Insufficient documentation

## 2014-01-10 DIAGNOSIS — S161XXA Strain of muscle, fascia and tendon at neck level, initial encounter: Secondary | ICD-10-CM

## 2014-01-10 DIAGNOSIS — Z8639 Personal history of other endocrine, nutritional and metabolic disease: Secondary | ICD-10-CM | POA: Insufficient documentation

## 2014-01-10 MED ORDER — METHOCARBAMOL 500 MG PO TABS: 500.0000 mg | ORAL_TABLET | Freq: Two times a day (BID) | ORAL | Status: DC | PRN

## 2014-01-10 MED ORDER — NAPROXEN 250 MG PO TABS
500.0000 mg | ORAL_TABLET | Freq: Once | ORAL | Status: AC
  Administered 2014-01-10: 500 mg via ORAL
  Filled 2014-01-10: qty 2

## 2014-01-10 MED ORDER — MELOXICAM 15 MG PO TABS: 15.0000 mg | ORAL_TABLET | Freq: Every day | ORAL | Status: DC
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
## 2014-01-10 NOTE — ED Notes (Signed)
[facility] are wnl. NAD noted. Pt given discharge instructions, all questions answered. Pt understood. Pt discharged with spouse.

## 2014-01-10 NOTE — Discharge Instructions (Signed)
Take Mobic as needed for pain. Take robaxin as needed for muscle spasm. Refer to attached documents for more information. Follow up with your doctor for further evaluation.
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
## 2014-01-12 NOTE — ED Provider Notes (Signed)
Medical screening examination/treatment/procedure(s) were performed by non-physician practitioner and as supervising physician I was immediately available for consultation/collaboration.   EKG Interpretation None        Laray AngerKathleen M Jeromie Gainor, DO 01/12/14 1527

## 2015-04-09 ENCOUNTER — Encounter (HOSPITAL_COMMUNITY): Payer: Self-pay | Admitting: Emergency Medicine

## 2015-04-09 ENCOUNTER — Emergency Department (HOSPITAL_COMMUNITY)
Admission: EM | Admit: 2015-04-09 | Discharge: 2015-04-09 | Disposition: A | Discharge status: Home / Self Care | Payer: Medicaid Other | Attending: Emergency Medicine | Admitting: Emergency Medicine

## 2015-04-09 DIAGNOSIS — Z791 Long term (current) use of non-steroidal anti-inflammatories (NSAID): Secondary | ICD-10-CM | POA: Insufficient documentation

## 2015-04-09 DIAGNOSIS — F410 Panic disorder [episodic paroxysmal anxiety] without agoraphobia: Secondary | ICD-10-CM | POA: Diagnosis not present

## 2015-04-09 DIAGNOSIS — Z720 Tobacco use: Secondary | ICD-10-CM | POA: Diagnosis not present

## 2015-04-09 DIAGNOSIS — Z8781 Personal history of (healed) traumatic fracture: Secondary | ICD-10-CM | POA: Insufficient documentation

## 2015-04-09 DIAGNOSIS — F419 Anxiety disorder, unspecified: Secondary | ICD-10-CM

## 2015-04-09 DIAGNOSIS — G8929 Other chronic pain: Secondary | ICD-10-CM | POA: Diagnosis not present

## 2015-04-09 DIAGNOSIS — Z8739 Personal history of other diseases of the musculoskeletal system and connective tissue: Secondary | ICD-10-CM | POA: Insufficient documentation

## 2015-04-09 DIAGNOSIS — Z8742 Personal history of other diseases of the female genital tract: Secondary | ICD-10-CM | POA: Insufficient documentation

## 2015-04-09 LAB — CBC WITH DIFFERENTIAL/PLATELET
BASOS ABS: 0 10*3/uL (ref 0.0–0.1)
Basophils Relative: 0 %
Eosinophils Absolute: 0 10*3/uL (ref 0.0–0.7)
Eosinophils Relative: 1 %
HCT: 43 % (ref 36.0–46.0)
HEMOGLOBIN: 15.4 g/dL — AB (ref 12.0–15.0)
LYMPHS ABS: 2.6 10*3/uL (ref 0.7–4.0)
LYMPHS PCT: 31 %
MCH: 32.3 pg (ref 26.0–34.0)
MCHC: 35.8 g/dL (ref 30.0–36.0)
MCV: 90.1 fL (ref 78.0–100.0)
Monocytes Absolute: 0.5 10*3/uL (ref 0.1–1.0)
Monocytes Relative: 6 %
NEUTROS ABS: 5.2 10*3/uL (ref 1.7–7.7)
NEUTROS PCT: 62 %
PLATELETS: 261 10*3/uL (ref 150–400)
RBC: 4.77 MIL/uL (ref 3.87–5.11)
RDW: 12.2 % (ref 11.5–15.5)
WBC: 8.3 10*3/uL (ref 4.0–10.5)

## 2015-04-09 LAB — BASIC METABOLIC PANEL
ANION GAP: 9 (ref 5–15)
BUN: 12 mg/dL (ref 6–20)
CHLORIDE: 105 mmol/L (ref 101–111)
CO2: 25 mmol/L (ref 22–32)
Calcium: 9.9 mg/dL (ref 8.9–10.3)
Creatinine, Ser: 0.68 mg/dL (ref 0.44–1.00)
GFR calc Af Amer: >60 mL/min (ref >60)
GLUCOSE: 96 mg/dL (ref 65–99)
Potassium: 3.8 mmol/L (ref 3.5–5.1)
Sodium: 139 mmol/L (ref 135–145)

## 2015-04-09 MED ORDER — LORAZEPAM 1 MG PO TABS: 1.0000 mg | ORAL_TABLET | Freq: Four times a day (QID) | ORAL | Status: DC | PRN

## 2015-04-09 MED ORDER — LORAZEPAM 1 MG PO TABS
1.0000 mg | ORAL_TABLET | Freq: Once | ORAL | Status: AC
  Administered 2015-04-09: 1 mg via ORAL
  Filled 2015-04-09: qty 1

## 2015-04-09 MED ORDER — SODIUM CHLORIDE 0.9 % IV BOLUS (SEPSIS)
1000.0000 mL | Freq: Once | INTRAVENOUS | Status: AC
  Administered 2015-04-09: 1000 mL via INTRAVENOUS

## 2015-04-09 NOTE — ED Notes (Signed)
Case management at bedside.

## 2015-04-09 NOTE — ED Provider Notes (Signed)
CSN: [id number]     Arrival date & time 04/09/15  1105 History   First MD Initiated Contact with Patient 04/09/15 1108     Chief Complaint  Patient presents with  . Anxiety     (Consider location/radiation/quality/duration/timing/severity/associated sxs/prior Treatment) The history is provided by the patient.     Pt with hx panic attacks, high anxiety, postpartum psychosis, cystic fibrosis, substance abuse presents with uncontrolled anxiety and panic attacks.  States her anxiety and panic attacks have been uncontrolled for years, but much worse over the past few days.  States she has a lot of stress recently and has not been coping well.  She has had more frequent panic attacks that feel exactly like the panic attacks she has had since she was a teenager - consist of tingling all over, SOB, feeling scared like a truck is about to hit her.  She can make the symptoms come on with worrying and thinking about things in her life and can decrease the symptoms with using relaxation techniques.  She is worried because the same thing has happened with her father who is bipolar.  Has had 30 lb weight loss because she is so stressed she cannot eat.  Denies fevers, chills, CP, SOB, cough, abdominal pain, urinary, vaginal, or bowel symptoms.  Denies leg swelling, recent immobilization.  Denies any change to her regular anxiety/panic attack pattern with the exception of severity and frequency.   Past Medical History  Diagnosis Date  . Cystic fibrosis   . DDD (degenerative disc disease)   . Thoracic compression fracture   . Back pain, chronic   . Coccygeal fracture   . Ovarian cyst   . Substance abuse    Past Surgical History  Procedure Laterality Date  . Laparoscopy    . Ovarian cyst removal    . Tubal ligation     Family History  Problem Relation Age of Onset  . Heart failure Mother   . Diabetes Mother   . Hypertension Father   . Heart failure Other   . Diabetes Other   . Cancer Other     Social History  Substance Use Topics  . Smoking status: Current Every Day Smoker -- 1.00 packs/day for 10 years    Types: Cigarettes  . Smokeless tobacco: Never Used  . Alcohol Use: Yes     Comment: occasional   OB History    No data available     Review of Systems  All other systems reviewed and are negative.     Allergies  Compazine and Phenergan  Home Medications   Prior to Admission medications   Medication Sig Start Date End Date Taking? Authorizing Provider  diphenhydramine-acetaminophen (TYLENOL PM) 25-500 MG TABS Take 2 tablets by mouth at bedtime as needed (sleep).    Historical Provider, MD  ibuprofen (ADVIL,MOTRIN) 200 MG tablet Take 400 mg by mouth 3 (three) times daily as needed (pain).    Historical Provider, MD  meloxicam (MOBIC) 15 MG tablet Take 1 tablet (15 mg total) by mouth daily. 01/10/14   Kaitlyn Szekalski, PA-C  methocarbamol (ROBAXIN) 500 MG tablet Take 1 tablet (500 mg total) by mouth 2 (two) times daily as needed for muscle spasms. 01/10/14   Emilia Beck, PA-C  Multiple Vitamin (MULTIVITAMIN WITH MINERALS) TABS tablet Take 1 tablet by mouth daily.    Historical Provider, MD   BP 124/66 mmHg  Pulse 94  Temp(Src) 98.4 F (36.9 C) (Oral)  Resp 20  Ht  (1.651  m)  Wt 117 lb 14.4 oz (53.479 kg)  BMI 19.62 kg/m2  SpO2 100%  LMP 04/02/2015 Physical Exam  Constitutional: No distress.  Thin.  Tearful, anxious.    HENT:  Head: Normocephalic and atraumatic.  Neck: Neck supple.  Cardiovascular: Normal rate and regular rhythm.   Pulmonary/Chest: Effort normal and breath sounds normal. No respiratory distress. She has no wheezes. She has no rales.  Abdominal: Soft. She exhibits no distension. There is no tenderness. There is no rebound and no guarding.  Neurological: She is alert.  Skin: She is not diaphoretic.  Psychiatric: Her mood appears anxious.  Nursing note and vitals reviewed.   ED Course  Procedures (including critical care  time) Labs Review Labs Reviewed  CBC WITH DIFFERENTIAL/PLATELET - Abnormal; Notable for the following:    Hemoglobin 15.4 (*)    All other components within normal limits  BASIC METABOLIC PANEL    Imaging Review No results found. I have personally reviewed and evaluated these images and lab results as part of my medical decision-making.   EKG Interpretation None       12:14 PM Case manager to see patient  1:52 PM Discussed all results with patient. Pt reports she is feeling much better with Ativan.  Appetite is back.  Again denies any CP, SOB, cough or symptoms that concern her regarding her cystic fibrosis, which she states is very mild with only slight GI issues.  Discussed strict return precautions, close follow up.     MDM   Final diagnoses:  Anxiety  Panic attacks    Afebrile, nontoxic patient with high level of anxiety with panic attacks x years, worse over the past several weeks.  Has had increased amount of stress.  Ativan given in ED with complete relief.     D/C home with resources, short course of ativan.  Discussed result, findings, treatment, and follow up  with patient.  Pt given return precautions.  Pt verbalizes understanding and agrees with plan.       I doubt any other EMC precluding discharge at this time including, but not necessarily limited to the following: ACS, PE, aortic dissection, complications related to her cystic fibrosis.   Trixie Dredge, PA-C 04/09/15 1559  Elwin Mocha, MD 04/10/15 [phone]

## 2015-04-09 NOTE — Discharge Instructions (Signed)
Read the information below.  Use the prescribed medication as directed.  Please discuss all new medications with your pharmacist.  You may return to the Emergency Department at any time for worsening condition or any new symptoms that concern you.   If there is any possibility that you might be pregnant, please let your health care provider know and discuss this with the pharmacist to ensure medication safety.  If you develop chest pain, shortness of breath, fever, you pass out, or become weak or dizzy, return to the ER for a recheck.     Panic Attacks Panic attacks are sudden, short-livedsurges of severe anxiety, fear, or discomfort. They may occur for no reason when you are relaxed, when you are anxious, or when you are sleeping. Panic attacks may occur for a number of reasons:   Healthy people occasionally have panic attacks in extreme, life-threatening situations, such as war or natural disasters. Normal anxiety is a protective mechanism of the body that helps [place] react to danger (fight or flight response).  Panic attacks are often seen with anxiety disorders, such as panic disorder, social anxiety disorder, generalized anxiety disorder, and phobias. Anxiety disorders cause excessive or uncontrollable anxiety. They may interfere with your relationships or other life activities.  Panic attacks are sometimes seen with other mental illnesses, such as depression and posttraumatic stress disorder.  Certain medical conditions, prescription medicines, and drugs of abuse can cause panic attacks. SYMPTOMS  Panic attacks start suddenly, peak within 20 minutes, and are accompanied by four or more of the following symptoms:  Pounding heart or fast heart rate (palpitations).  Sweating.  Trembling or shaking.  Shortness of breath or feeling smothered.  Feeling choked.  Chest pain or discomfort.  Nausea or strange feeling in your stomach.  Dizziness, light-headedness, or feeling like you will  faint.  Chills or hot flushes.  Numbness or tingling in your lips or hands and feet.  Feeling that things are not real or feeling that you are not yourself.  Fear of losing control or going crazy.  Fear of dying. Some of these symptoms can mimic serious medical conditions. For example, you may think you are having a heart attack. Although panic attacks can be very scary, they are not life threatening. DIAGNOSIS  Panic attacks are diagnosed through an assessment by your health care provider. Your health care provider will ask questions about your symptoms, such as where and when they occurred. Your health care provider will also ask about your medical history and use of alcohol and drugs, including prescription medicines. Your health care provider may order blood tests or other studies to rule out a serious medical condition. Your health care provider may refer you to a mental health professional for further evaluation. TREATMENT   Most healthy people who have one or two panic attacks in an extreme, life-threatening situation will not require treatment.  The treatment for panic attacks associated with anxiety disorders or other mental illness typically involves counseling with a mental health professional, medicine, or a combination of both. Your health care provider will help determine what treatment is best for you.  Panic attacks due to physical illness usually go away with treatment of the illness. If prescription medicine is causing panic attacks, talk with your health care provider about stopping the medicine, decreasing the dose, or substituting another medicine.  Panic attacks due to alcohol or drug abuse go away with abstinence. Some adults need professional help in order to stop drinking or using drugs.  HOME CARE INSTRUCTIONS   Take all medicines as directed by your health care provider.   Schedule and attend follow-up visits as directed by your health care provider. It is  important to keep all your appointments. SEEK MEDICAL CARE IF:  You are not able to take your medicines as prescribed.  Your symptoms do not improve or get worse. SEEK IMMEDIATE MEDICAL CARE IF:   You experience panic attack symptoms that are different than your usual symptoms.  You have serious thoughts about hurting yourself or others.  You are taking medicine for panic attacks and have a serious side effect. MAKE SURE YOU:  Understand these instructions.  Will watch your condition.  Will get help right away if you are not doing well or get worse. Document Released: 07/11/2005 Document Revised: 07/16/2013 Document Reviewed: 02/22/2013 [facility] Patient Information 2015 [place], [place]. This information is not intended to replace advice given to you by your health care provider. Make sure you discuss any questions you have with your health care provider.  Behavioral Health Resources in the [facility]  Intensive Outpatient Programs: [facility]      [address] [place], [place] [phone] Both a day and evening program       [facility] Outpatient     [address]        [place], [place] [address] [phone]         ADS: Alcohol & Drug Svcs [address] [place] [phone]  [facility] Mental Health ACCESS LINE: [phone] or [phone] [address] [place], [place] [address] [email or web address]  Mobile Crisis Teams:                                        Therapeutic Alternatives         Mobile Crisis Care Unit [phone]             [facility] [address] Ginette Otto [phone]                                         Interventionist [address] [place] [phone]  Self-Help/Support Groups: Mental Health Assoc. of [employer] of support groups [phone] (call for more  info)  Narcotics Anonymous (NA) Caring Services [address] [place] - 2 meetings at this location  Residential Treatment Programs:  [facility]      [address]        [place]       [phone]         [facility] [address] [place] [address] [place], [place]  [address] [phone]  [facility]  [address] [place], [place] [address] [phone] Admissions: 8am-3pm M-F  [facility]     [address]        [place], [place] [address]       [phone]         [facility] [address] [place], [place] [phone]  [facility] [address] [place], [place] [phone]  [facility] - Intensive Outpatient      [address]     [place], [place]  [phone]         [employer] ([facility]     [address] [place], [place] [phone] or [phone]  Residential Treatment Services (RTS)  [address] [place], [place] [phone]  [facility] [address]                                               [address] Mariano [place] [phone]  [facility] Resources: [phone]               General Therapy                                                Angie Fava, PhD        [address] [place], [place] [address]         [phone]   Insurance  [address] Behavioral   [address] [place], [place] [address] [phone]  [facility] [address] [place], [place] [address] [phone] Insurance/Medicaid/sponsorship through [facility] and Families                                              [address]                                        [place], [place] [address]    Therapy/tele-psych/case         [phone]          [facility] [address], [place]  [address]   Adolescent/group home/case management [phone]                                           Creola Corn PhD       General therapy       Insurance   [phone]         Dr. Lolly Mustache Insurance [phone] M-F  [place] [facility]Residential Medicaid, sponsorship [phone]    Emergency Department Resource Guide 1) Find a Doctor and Pay Out of Pocket Although you won't have to find out who is covered by your insurance plan, it is a good idea to ask around and get recommendations. You will then need to call the office and see if the doctor you have chosen will accept you as a new patient and what types of options they offer for patients who are self-pay. Some doctors offer discounts or will set up payment plans for their patients who do not have insurance, but you will need to ask so you aren't surprised when you get to your appointment.  2) Contact Your Local Health Department Not all health departments have  doctors that can see patients for sick visits, but many do, so it is worth a call to see if yours does. If you don't know where your local health department is, you can check in your phone book. The CDC also has a tool to help you locate your state's health department, and many state websites also have listings of all of their local health departments.  3) Find a Walk-in Clinic If your illness is not likely to be very severe or complicated, you may want to try a walk in clinic. These are popping up all over the country in pharmacies, drugstores, and shopping centers. They're usually staffed by nurse practitioners or physician assistants that have been trained to treat common illnesses and complaints. They're usually fairly quick and inexpensive. However, if you have serious medical issues or chronic medical problems, these are probably not your best option.  No Primary Care Doctor: - Call Health Connect at  [phone] - they can help you locate a primary care doctor that  accepts  your insurance, provides certain services, etc. - Physician Referral Service- [phone]  Chronic Pain Problems: Organization         Address  Phone   Notes  [facility]  [phone] Patients need to be referred by their primary care doctor.   Medication Assistance: Organization         Address  Phone   Notes  [facility] Medication [facility] [address] [place] [place], [place] [address] [phone] --Must be a resident of [facility] -- Must have NO insurance coverage whatsoever (no Medicaid/ Medicare, etc.) -- The pt. MUST have a primary care doctor that directs their care regularly and follows them in the community   MedAssist  [phone]   [employer]  [phone]    Agencies that provide inexpensive medical care: Organization         Address  Phone   Notes  [facility]  [phone]   [facility]    [phone]   [facility] [address] [place], [place] [address] [phone]   [facility] [address] [place], [place] [phone]   Planned Parenthood    [phone]   [facility]    [phone]   [facility]  [address], [place] Phone:  [phone], Fax:  [phone] Hours of Operation:  9 am - 6 pm, M-F.  Also accepts Medicaid/Medicare and self-pay.  [facility]  [address], [place] Phone: [phone], Fax: [phone]. Hours of Operation:  8:30 am - 5:30 pm, M-F.  Also accepts Medicaid and self-pay.  [facility] [place] [address] [place] [address], [place] Phone: [phone]   [facility] [address] Natasha Bence [place], [place] [phone], Ext. [phone] Mondays & Thursdays: 7-9 AM.  First 15 patients are seen on a first come, first serve basis.    Medicaid-accepting [facility] Providers:  Organization          Address  Phone   Notes  [facility] [address], [place] [phone] Also accepts self-pay patients.  [facility] [address] Laurell Josephs [place], [place]  [phone]   [facility] [address], [place] [phone]   [facility] [address] [place], [place] [phone]   Renaye Rakers [address], [place]   [phone] Only accepts [place] Access [place] patients after they have their name applied to their card.   Self-Pay (no insurance) in [facility]:  Organization         Address  Phone   Notes  Sickle Cell Patients, [facility] [address] [place], [place] [phone]   [facility] [address] [place], [place] [phone]   [facility]  [address], [place] [phone]   [facility]/Dr. Osei-Bonsu  [address], [place] or [address], [place] [phone] Phone number for both [place] and [place] locations is the same.  [facility] [address] [place] [phone]   [facility] [address] [place] or [address] [phone]   [facility] [address], [place] [phone], phone; [phone], fax Sees patients 1st and 3rd Saturday of every month.  Must not qualify for public or private insurance (i.e. Medicaid, Medicare, [place] Health Choice, Veterans' Benefits)  Household income should be no more than 200% of the poverty level The clinic cannot treat you if you are pregnant or think you are pregnant  Sexually transmitted diseases are not treated at the clinic.    Dental Care: Organization         Address  Phone  Notes  [facility] Department of [facility] [address] [place],  [place] [phone] Accepts children up to age 61 who are enrolled in [place] or [place] Health Choice; pregnant women with a Medicaid card; and children who have applied for Medicaid or [place] Health Choice, but were declined, whose parents can pay a reduced fee at time of service.  [facility] Department of [facility]  [address], [place] [phone] Accepts children up to age 60 who are enrolled in [place] or [place] Health Choice; pregnant women with a Medicaid card; and children who have applied for Medicaid or  Health Choice, but were declined, whose parents can pay a reduced fee at time of service.  [facility]  [address] [place], [place] [phone] Patients are seen by appointment only. Walk-ins are not accepted. [facility] will see patients 13 years of age and older. Monday - Tuesday (8am-5pm) Most Wednesdays (8:30-5pm) $30 per visit, cash only  [facility] Adult Dental Access PROGRAM  [address], [facility] [phone] Patients are seen by appointment only. Walk-ins are not accepted. [facility] will see patients 78 years of age and older. One Wednesday Evening (Monthly: Volunteer Based).  $30 per visit, cash only  [employer]  [phone] for adults; Children under age 26, call Graduate Pediatric Dentistry at [phone]. Children aged 87-14, please call [phone] to request a pediatric application.  Dental services are provided in all areas of dental care including fillings, crowns and bridges, complete and partial dentures, implants, gum treatment, root canals, and extractions. Preventive care is also provided. Treatment is provided to both adults and children. Patients are selected via a lottery and there is often a waiting list.   [facility] [address], [place]  [phone] [email or web address]   [facility] [address] [place], [place]  [phone], Ext. [phone] Second and Fourth Thursday of each month, opens at 6:30 AM; Clinic ends at 9 AM.  Patients are seen on a first-come first-served basis, and a limited number are seen during each clinic.   [facility]  [address] Ether Griffins [place], [place] [phone]   Eligibility Requirements You must have lived in [place], [place], or [place] counties for at least the last three months.   You cannot be eligible for state or federal sponsored [place], including [employer], [place], or [employer].   You generally cannot be eligible for healthcare insurance through your employer.    How to apply: Eligibility screenings are held every Tuesday and Wednesday afternoon from 1:00 pm until 4:00 pm. You do not need an appointment for the interview!  [facility] [address], [place], [place] [phone]   [facility] Health Department  [phone]   [facility] Health Department  [phone]   [facility] Health Department  [phone]    Behavioral Health Resources in the Community: Intensive Outpatient Programs Organization         Address  Phone  Notes  [facility] [address], [place], [place] [phone]   [facility] Outpatient [address], [place], [place] [phone]   ADS: Alcohol & Drug Svcs [address], [place], [place]  [phone]   [facility] Mental Health [address],  [place], [place] [phone] or [phone]   Substance Abuse Resources Organization         Address  Phone  Notes  Alcohol and Drug Services  8384138873   [facility]  [phone]   [facility] [place]  [phone]   Floydene Flock  [phone]   Residential & Outpatient Substance Abuse Program  [phone]   Psychological Services Organization         Address  Phone  Notes  [facility] Behavioral Health  [phone]   [facility] Services  [phone]    [facility] Mental Health [address], [place] [phone] or [phone]    Mobile Crisis Teams Organization         Address  Phone  Notes  [facility], Mobile Crisis Care Unit  [phone]   [facility]  [address] [place], [place] [phone]   Doristine Locks [address] [place] [phone]    Self-Help/Support Groups Organization         Address  Phone             Notes  Mental Health Assoc. of [place] - variety of support groups  336- I7437963 Call for more information  Narcotics Anonymous (NA), Caring Services [address], [employer] [place]  2 meetings at this location   Statistician         Address  Phone  Notes  [facility] [address],    [place]  [phone]   [facility]  [address], [place] [address], [place], [place] [phone]   [facility] [address] [place], [place] [phone] Admissions: 8am-3pm M-F  [facility] [address]    [place], [place] [phone]   [facility] [address] Starling Manns [employer] [place], [place] [phone]   [facility] [address],  [place], [place] [phone]   [facility] - Intensive Outpatient [phone] Laurell Josephs [address], [place], [place]  [phone]   [facility] (Addiction Recovery Care [facility]oc.) [address] Dhanush Jokerst Dun[address].,  [place], [place] [phone] or [phone]   [facility] (RTS) [address], [place], [place] [phone] Accepts Medicaid  [facility] [place] [address]  [place] [phone] Substance Abuse/Addiction Treatment   [facility] Organization         Address  Phone  Notes  [facility]  [phone]   Angie Fava, PhD [address] Ervin Knack [place], [place]   [phone] or [phone]   [facility] Behavioral   [address] [place], [place] [phone]   [facility] [address] [place], [place] [phone] Insurance/Medicaid/sponsorship through [facility] and Families [address]                                    [place], [place] [phone] Therapy/tele-psych/case  [facility] [address], [place] [phone]    Dr. Lolly Mustache  [phone]   [facility] [place]  [employer] [facility] Dept. 1) [address] [place] 2) [address], [place] 3)  [address], [place] [phone]  [phone]   [facility] Child Abuse Hotline (917)568-8165 or [phone] (After Hours)

## 2015-04-09 NOTE — ED Notes (Signed)
Pt reports she has terrible anxiety. Pt tearful, denies SI/ HI. Pt reports recent stressors. VSS.

## 2015-04-23 ENCOUNTER — Emergency Department (HOSPITAL_COMMUNITY)
Admission: EM | Admit: 2015-04-23 | Discharge: 2015-04-23 | Disposition: A | Discharge status: Home / Self Care | Payer: Medicaid Other | Attending: Emergency Medicine | Admitting: Emergency Medicine

## 2015-04-23 ENCOUNTER — Encounter (HOSPITAL_COMMUNITY): Payer: Self-pay | Admitting: Emergency Medicine

## 2015-04-23 DIAGNOSIS — F419 Anxiety disorder, unspecified: Secondary | ICD-10-CM | POA: Insufficient documentation

## 2015-04-23 DIAGNOSIS — Z8781 Personal history of (healed) traumatic fracture: Secondary | ICD-10-CM | POA: Insufficient documentation

## 2015-04-23 DIAGNOSIS — R0602 Shortness of breath: Secondary | ICD-10-CM | POA: Insufficient documentation

## 2015-04-23 DIAGNOSIS — G8929 Other chronic pain: Secondary | ICD-10-CM | POA: Insufficient documentation

## 2015-04-23 DIAGNOSIS — R079 Chest pain, unspecified: Secondary | ICD-10-CM | POA: Insufficient documentation

## 2015-04-23 DIAGNOSIS — Z8739 Personal history of other diseases of the musculoskeletal system and connective tissue: Secondary | ICD-10-CM | POA: Insufficient documentation

## 2015-04-23 DIAGNOSIS — H539 Unspecified visual disturbance: Secondary | ICD-10-CM | POA: Insufficient documentation

## 2015-04-23 DIAGNOSIS — Z8639 Personal history of other endocrine, nutritional and metabolic disease: Secondary | ICD-10-CM | POA: Insufficient documentation

## 2015-04-23 DIAGNOSIS — Z720 Tobacco use: Secondary | ICD-10-CM | POA: Insufficient documentation

## 2015-04-23 DIAGNOSIS — Z3202 Encounter for pregnancy test, result negative: Secondary | ICD-10-CM | POA: Insufficient documentation

## 2015-04-23 DIAGNOSIS — Z8742 Personal history of other diseases of the female genital tract: Secondary | ICD-10-CM | POA: Insufficient documentation

## 2015-04-23 LAB — POC URINE PREG, ED: Preg Test, Ur: NEGATIVE

## 2015-04-23 MED ORDER — LORAZEPAM 1 MG PO TABS
1.0000 mg | ORAL_TABLET | Freq: Once | ORAL | Status: AC
  Administered 2015-04-23: 1 mg via ORAL
  Filled 2015-04-23: qty 1

## 2015-04-23 MED ORDER — LORAZEPAM 1 MG PO TABS: 1.0000 mg | ORAL_TABLET | Freq: Four times a day (QID) | ORAL | Status: DC | PRN

## 2015-04-23 NOTE — ED Notes (Addendum)
Per EMS pt states she was sitting home and began feeling like she was having a panic attack. States her vision started blurring, hands felt tingly and legs felt shaky, and while EMS was on scene she was hyperventilating for a short period of time. She told EMS she had only smoked a small amount of "regular marijuana."  Pt states she's been having problems managing her anxiety after having a seizure 2 weeks ago. Complaining of difficulty swallowing, numbness/tingling in hands and feet. Pt actively hyperventilating and complaining of difficulty swallowing d/t dry throat. Gave pt ice chips, which she swallowed without difficulty, and she stated it helped.

## 2015-04-23 NOTE — ED Notes (Signed)
Bed: EAV4 Expected date:  Expected time:  Means of arrival:  Comments: EMS 34 yo female anxiety attack

## 2015-04-23 NOTE — ED Provider Notes (Signed)
CSN: [id number]     Arrival date & time 04/23/15  1959 History  By signing my name below, I, Marisa Palmer, attest that this documentation has been prepared under the direction and in the presence of [facility], NP-C. Electronically Signed: Phillis Palmer, ED Scribe. 04/23/2015. 9:48 PM.  No chief complaint on file.  Patient is a 34 y.o. female presenting with anxiety. The history is provided by the patient. No language interpreter was used.  Anxiety This is a new problem. The current episode started 12 to 24 hours ago. The problem occurs constantly. The problem has not changed since onset.Associated symptoms include chest pain and shortness of breath. She has tried nothing for the symptoms.  HPI Comments: Marisa Palmer is a 34 y.o. female with hx of substance abuse and postpartum depression brought in by EMS who presents to the Emergency Department complaining of anxiety. Pt states that she a history of Vicodin abuse and states that she quit her job due to DDD 2 months ago and has lost a lot of weight. She states that she also recently had a seizure and was evaluated in the ED at that time. Pt has been taking Ativan but does not have insurance and has since run out. She reports associated dizziness, lightheadedness, chest tightness, otalgia, blurred vision, diaphoresis, hand cramping and felt like she cannot breathe. She states that her episode today "hit me out of nowhere; I felt like I was dying or having another seizure. "I cannot swallow right ." Pt states that she has been seen by the mental health clinic in the past but does not have a PCP. Denies SI/HI.    Past Medical History  Diagnosis Date  . Cystic fibrosis   . DDD (degenerative disc disease)   . Thoracic compression fracture   . Back pain, chronic   . Coccygeal fracture   . Ovarian cyst   . Substance abuse    Past Surgical History  Procedure Laterality Date  . Laparoscopy    . Ovarian cyst removal    . Tubal ligation     Family  History  Problem Relation Age of Onset  . Heart failure Mother   . Diabetes Mother   . Hypertension Father   . Heart failure Other   . Diabetes Other   . Cancer Other    Social History  Substance Use Topics  . Smoking status: Current Every Day Smoker -- 1.00 packs/day for 10 years    Types: Cigarettes  . Smokeless tobacco: Never Used  . Alcohol Use: Yes     Comment: occasional   OB History    No data available     Review of Systems  Constitutional: Positive for diaphoresis.  Eyes: Positive for visual disturbance (blurred vision).  Respiratory: Positive for shortness of breath.   Cardiovascular: Positive for chest pain.  Neurological: Positive for dizziness and light-headedness.  Psychiatric/Behavioral: Negative for suicidal ideas. The patient is nervous/anxious.   All other systems reviewed and are negative.  Allergies  Compazine and Phenergan  Home Medications   Prior to Admission medications   Medication Sig Start Date End Date Taking? Authorizing Provider  diphenhydramine-acetaminophen (TYLENOL PM) 25-500 MG TABS Take 2 tablets by mouth at bedtime as needed (sleep).   Yes Historical Provider, MD  ibuprofen (ADVIL,MOTRIN) 200 MG tablet Take 400 mg by mouth 3 (three) times daily as needed (pain).   Yes Historical Provider, MD  Multiple Vitamin (MULTIVITAMIN WITH MINERALS) TABS tablet Take 1 tablet by mouth  daily.   Yes Historical Provider, MD  LORazepam (ATIVAN) 1 MG tablet Take 1 tablet (1 mg total) by mouth every 6 (six) hours as needed for anxiety. 04/23/15   Hope Orlene Och, NP  meloxicam (MOBIC) 15 MG tablet Take 1 tablet (15 mg total) by mouth daily. Patient not taking: Reported on 04/23/2015 01/10/14   Emilia Beck, PA-C  methocarbamol (ROBAXIN) 500 MG tablet Take 1 tablet (500 mg total) by mouth 2 (two) times daily as needed for muscle spasms. Patient not taking: Reported on 04/23/2015 01/10/14   Emilia Beck, PA-C   BP 116/73 mmHg  Pulse 103  Temp(Src) 98  F (36.7 C) (Oral)  Resp 18  SpO2 98%  LMP 04/02/2015  Physical Exam  Constitutional: She is oriented to person, place, and time. She appears well-developed and well-nourished.  HENT:  Head: Normocephalic and atraumatic.  Mouth/Throat: Uvula is midline, oropharynx is clear and moist and mucous membranes are normal. No posterior oropharyngeal edema.  Eyes: Conjunctivae and EOM are normal. Pupils are equal, round, and reactive to light. No scleral icterus.  Neck: Normal range of motion. Neck supple.  No cervical nodes present  Cardiovascular: Normal rate and regular rhythm.   Pulmonary/Chest: Effort normal and breath sounds normal. She exhibits no tenderness.  Abdominal: Soft. There is no tenderness. There is no CVA tenderness.  Musculoskeletal: Normal range of motion.  Lymphadenopathy:    She has no cervical adenopathy.  Neurological: She is alert and oriented to person, place, and time. No cranial nerve deficit. She displays a negative Romberg sign.  Stands on one foot without difficulty; rapid alternating movements without difficulty  Skin: Skin is warm and dry.  Psychiatric: She has a normal mood and affect. Her behavior is normal.  Nursing note and vitals reviewed.  ED Course  Procedures (including critical care time) DIAGNOSTIC STUDIES: Oxygen Saturation is 98% on RA, normal by my interpretation.    COORDINATION OF CARE: 8:56 PM-Discussed treatment plan which includes monitoring pt with pt at bedside and pt agreed to plan.   Labs Review UPT negataive    EKG Interpretation   Date/Time:  Thursday April 23 2015 21:17:38 EDT Ventricular Rate:  61 PR Interval:  109 QRS Duration: 91 QT Interval:  417 QTC Calculation: 420 R Axis:   90 Text Interpretation:  Sinus rhythm Short PR interval Borderline right axis  deviation No significant change since last tracing Confirmed by NGUYEN,  EMILY ([id number]) on 04/23/2015 9:31:26 PM      Dr. Raj Janus examine the patient and read  initial EKG and second EKG prior to d/c.  MDM  34 y.o. female with episode of chest tightness and feeling anxious similar to previous episodes of anxiety. Stable for d/c with symptoms resolved after Ativan. Discussed with the patient follow up with [facility]. Will give Rx for Ativan. Patient agrees with plan.   Final diagnoses:  Anxiety   I personally performed the services described in this documentation, which was scribed in my presence. The recorded information has been reviewed and is accurate.   [address] Bell, NP 04/26/15 [id number]  Leta Baptist, MD 04/27/15 [phone]

## 2015-04-23 NOTE — ED Notes (Signed)
Pt states she's feeling much better after the medication. No longer having the numbness/tingling, difficulty swallowing or hyperventilating. Alerted PA

## 2015-04-23 NOTE — ED Notes (Signed)
Informed Cyndie Chime MD of patient condition, MD in to see patient. Entered verbal orders from MD, will continue to monitor patient.

## 2015-04-23 NOTE — Discharge Instructions (Signed)
Call [facility] tomorrow and tell them you were seen in the ED with panic attack and have had similar ER visits. You can discuss your need for help with medications and a doctor.

## 2017-04-26 ENCOUNTER — Encounter (HOSPITAL_COMMUNITY): Payer: Self-pay

## 2017-04-26 ENCOUNTER — Emergency Department (HOSPITAL_COMMUNITY)
Admission: EM | Admit: 2017-04-26 | Discharge: 2017-04-26 | Disposition: A | Discharge status: Home / Self Care | Payer: Self-pay | Attending: Emergency Medicine | Admitting: Emergency Medicine

## 2017-04-26 DIAGNOSIS — R109 Unspecified abdominal pain: Secondary | ICD-10-CM | POA: Insufficient documentation

## 2017-04-26 DIAGNOSIS — Z5321 Procedure and treatment not carried out due to patient leaving prior to being seen by health care provider: Secondary | ICD-10-CM | POA: Insufficient documentation

## 2017-04-26 LAB — CBC
HEMATOCRIT: 43.9 % (ref 36.0–46.0)
HEMOGLOBIN: 15 g/dL (ref 12.0–15.0)
MCH: 33.2 pg (ref 26.0–34.0)
MCHC: 34.2 g/dL (ref 30.0–36.0)
MCV: 97.1 fL (ref 78.0–100.0)
Platelets: 252 10*3/uL (ref 150–400)
RBC: 4.52 MIL/uL (ref 3.87–5.11)
RDW: 13 % (ref 11.5–15.5)
WBC: 14.2 10*3/uL — AB (ref 4.0–10.5)

## 2017-04-26 LAB — COMPREHENSIVE METABOLIC PANEL
ALBUMIN: 3.7 g/dL (ref 3.5–5.0)
ALT: 51 U/L (ref 14–54)
ANION GAP: 11 (ref 5–15)
AST: 51 U/L — ABNORMAL HIGH (ref 15–41)
Alkaline Phosphatase: 133 U/L — ABNORMAL HIGH (ref 38–126)
BUN: 5 mg/dL — AB (ref 6–20)
CHLORIDE: 101 mmol/L (ref 101–111)
CO2: 22 mmol/L (ref 22–32)
Calcium: 9.1 mg/dL (ref 8.9–10.3)
Creatinine, Ser: 0.79 mg/dL (ref 0.44–1.00)
GFR calc Af Amer: >60 mL/min (ref >60)
GFR calc non Af Amer: >60 mL/min (ref >60)
Glucose, Bld: 139 mg/dL — ABNORMAL HIGH (ref 65–99)
Potassium: 3.8 mmol/L (ref 3.5–5.1)
Sodium: 134 mmol/L — ABNORMAL LOW (ref 135–145)
Total Bilirubin: 1 mg/dL (ref 0.3–1.2)
Total Protein: 7 g/dL (ref 6.5–8.1)

## 2017-04-26 LAB — URINALYSIS, ROUTINE W REFLEX MICROSCOPIC
BILIRUBIN URINE: NEGATIVE
Glucose, UA: NEGATIVE mg/dL
Ketones, ur: 5 mg/dL — AB
NITRITE: NEGATIVE
Protein, ur: 100 mg/dL — AB
SPECIFIC GRAVITY, URINE: 1.013 (ref 1.005–1.030)
pH: 6 (ref 5.0–8.0)

## 2017-04-26 LAB — I-STAT BETA HCG BLOOD, ED (MC, WL, AP ONLY): I-stat hCG, quantitative: <5 m[IU]/mL (ref <5)

## 2017-04-26 LAB — LIPASE, BLOOD: LIPASE: 28 U/L (ref 11–51)

## 2017-04-26 NOTE — ED Triage Notes (Signed)
Patient complains of Right sided abdominal pain radiating to flank with nausea and vomiting x 2 days. Had tylenol 2 hours pta. Denies dysuria, no hx of stones

## 2017-04-26 NOTE — ED Notes (Signed)
Pt LWBS after triage, pt encouraged to remain to be seen by MD, pt left [facility] ED, will be discharged out of the system

## 2019-02-28 ENCOUNTER — Emergency Department (HOSPITAL_COMMUNITY)
Admission: EM | Admit: 2019-02-28 | Discharge: 2019-02-28 | Disposition: A | Discharge status: Home / Self Care | Payer: Self-pay | Attending: Emergency Medicine | Admitting: Emergency Medicine

## 2019-02-28 ENCOUNTER — Emergency Department (HOSPITAL_COMMUNITY): Payer: Self-pay

## 2019-02-28 ENCOUNTER — Encounter (HOSPITAL_COMMUNITY): Payer: Self-pay

## 2019-02-28 DIAGNOSIS — W182XXA Fall in (into) shower or empty bathtub, initial encounter: Secondary | ICD-10-CM | POA: Insufficient documentation

## 2019-02-28 DIAGNOSIS — Z79899 Other long term (current) drug therapy: Secondary | ICD-10-CM | POA: Insufficient documentation

## 2019-02-28 DIAGNOSIS — F17210 Nicotine dependence, cigarettes, uncomplicated: Secondary | ICD-10-CM | POA: Insufficient documentation

## 2019-02-28 DIAGNOSIS — M542 Cervicalgia: Secondary | ICD-10-CM | POA: Insufficient documentation

## 2019-02-28 DIAGNOSIS — Y92012 Bathroom of single-family (private) house as the place of occurrence of the external cause: Secondary | ICD-10-CM | POA: Insufficient documentation

## 2019-02-28 DIAGNOSIS — M25522 Pain in left elbow: Secondary | ICD-10-CM | POA: Insufficient documentation

## 2019-02-28 DIAGNOSIS — Y939 Activity, unspecified: Secondary | ICD-10-CM | POA: Insufficient documentation

## 2019-02-28 DIAGNOSIS — R51 Headache: Secondary | ICD-10-CM | POA: Insufficient documentation

## 2019-02-28 NOTE — ED Notes (Signed)
Pt ambulated in hallway with steady gait.

## 2019-02-28 NOTE — ED Triage Notes (Signed)
Pt reports that she was drinking tonight and normal does not drink, went to take a shower, slipped and fell backwards, hit head, no LOC, moments of confusion at first, pain to L elbow, pt AxO x 4

## 2019-02-28 NOTE — ED Notes (Addendum)
Went to pt room to discharge pt, pt not found in room or bathrooms, pt left without paperwork or discharge teaching.

## 2019-02-28 NOTE — Discharge Instructions (Signed)
Recommend tylenol or motrin for pain control.  Can use ice on the elbow to help with pain/swelling as well. Follow-up with your primary care doctor. Return here for any new/acute changes.

## 2019-02-28 NOTE — ED Provider Notes (Signed)
[facility]   CSN: [id number] Arrival date & time: 02/28/19  0353     History   Chief Complaint Chief Complaint  Patient presents with   Fall    HPI Marisa Palmer is a 38 y.o. female.     The history is provided by the patient and medical records.  Fall     38 year old female with history of chronic back and neck pain, degenerative disc disease, history of polysubstance abuse, presenting to the ED after a fall.  States she was drinking at her house today with her friend and went to go take a shower when she slipped and fell in the shower.  She struck the back of her head and her left elbow on the shower Marti.  There was no loss of consciousness.  States initially she felt okay was not having any pain, but has since developed pain in the back of her head, neck, and left elbow.  States her left elbow seems swollen.  She is right-hand dominant.  EMS initially reported some confusion but that seems to have resolved at this time.  Past Medical History:  Diagnosis Date   Back pain, chronic    Coccygeal fracture (HCC)    Cystic fibrosis    DDD (degenerative disc disease)    Ovarian cyst    Substance abuse (HCC)    Thoracic compression fracture (HCC)     There are no active problems to display for this patient.   Past Surgical History:  Procedure Laterality Date   LAPAROSCOPY     OVARIAN CYST REMOVAL     TUBAL LIGATION       OB History   No obstetric history on file.      Home Medications    Prior to Admission medications   Medication Sig Start Date End Date Taking? Authorizing Provider  diphenhydramine-acetaminophen (TYLENOL PM) 25-500 MG TABS Take 2 tablets by mouth at bedtime as needed (sleep).    [provider]  ibuprofen (ADVIL,MOTRIN) 200 MG tablet Take 400 mg by mouth 3 (three) times daily as needed (pain).    [provider]  LORazepam (ATIVAN) 1 MG tablet Take 1 tablet (1 mg  total) by mouth every 6 (six) hours as needed for anxiety. 04/23/15   Janne NapoleonNeese, Hope M, NP  meloxicam (MOBIC) 15 MG tablet Take 1 tablet (15 mg total) by mouth daily. Patient not taking: Reported on 04/23/2015 01/10/14   Emilia BeckSzekalski, Kaitlyn, PA-C  methocarbamol (ROBAXIN) 500 MG tablet Take 1 tablet (500 mg total) by mouth 2 (two) times daily as needed for muscle spasms. Patient not taking: Reported on 04/23/2015 01/10/14   Emilia BeckSzekalski, Kaitlyn, PA-C  Multiple Vitamin (MULTIVITAMIN WITH MINERALS) TABS tablet Take 1 tablet by mouth daily.    [provider]    Family History Family History  Problem Relation Age of Onset   Heart failure Mother    Diabetes Mother    Hypertension Father    Heart failure Other    Diabetes Other    Cancer Other     Social History Social History   Tobacco Use   Smoking status: Current Every Day Smoker    Packs/day: 1.00    Years: 10.00    Pack years: 10.00    Types: Cigarettes   Smokeless tobacco: Never Used  Substance Use Topics   Alcohol use: Yes    Comment: occasional   Drug use: Yes    Types: Morphine, Oxycodone, Hydrocodone, Marijuana  Comment: opiate     Allergies   Compazine [prochlorperazine] and Phenergan [promethazine hcl]   Review of Systems Review of Systems  Musculoskeletal: Positive for arthralgias and neck pain.  All other systems reviewed and are negative.    Physical Exam Updated Vital Signs BP 127/81    Pulse 96    Temp 98.6 F (37 C) (Oral)    Resp 17    SpO2 97%   Physical Exam Vitals signs and nursing note reviewed.  Constitutional:      Appearance: She is well-developed.  HENT:     Head: Normocephalic and atraumatic.     Comments: Tenderness along the occiput without open wound or laceration Eyes:     Conjunctiva/sclera: Conjunctivae normal.     Pupils: Pupils are equal, round, and reactive to light.  Neck:     Comments: C-collar in place Cardiovascular:     Rate and Rhythm: Normal rate and  regular rhythm.     Heart sounds: Normal heart sounds.  Pulmonary:     Effort: Pulmonary effort is normal.     Breath sounds: Normal breath sounds.  Abdominal:     General: Bowel sounds are normal.     Palpations: Abdomen is soft.  Musculoskeletal: Normal range of motion.     Comments: Left elbow with tenderness and swelling of the olecranon bursa, ranging elbow without difficulty when distracted, normal grip strength, radial pulse, distal sensation  Skin:    General: Skin is warm and dry.  Neurological:     Mental Status: She is alert and oriented to person, place, and time.      ED Treatments / Results  Labs (all labs ordered are listed, but only abnormal results are displayed) Labs Reviewed - No data to display  EKG None  Radiology Dg Elbow Complete Left  Result Date: 02/28/2019 CLINICAL DATA:  Fall with elbow pain EXAM: LEFT ELBOW - COMPLETE 3+ VIEW COMPARISON:  None. FINDINGS: Soft tissue swelling at the level of the olecranon process. No joint effusion, fracture, or dislocation. IMPRESSION: Soft tissue swelling without fracture. Electronically Signed   By: Monte Fantasia M.D.   On: 02/28/2019 04:32   Ct Head Wo Contrast  Result Date: 02/28/2019 CLINICAL DATA:  Fall with head injury EXAM: CT HEAD WITHOUT CONTRAST CT CERVICAL SPINE WITHOUT CONTRAST TECHNIQUE: Multidetector CT imaging of the head and cervical spine was performed following the standard protocol without intravenous contrast. Multiplanar CT image reconstructions of the cervical spine were also generated. COMPARISON:  None. FINDINGS: CT HEAD FINDINGS Brain: No evidence of acute infarction, hemorrhage, hydrocephalus, extra-axial collection or mass lesion/mass effect. Vascular: No hyperdense vessel or unexpected calcification. Skull: Normal. Negative for fracture or focal lesion. Sinuses/Orbits: Negative CT CERVICAL SPINE FINDINGS Alignment: No traumatic malalignment Skull base and vertebrae: Negative for acute fracture.  Right-sided cervical rib Soft tissues and spinal canal: No prevertebral fluid or swelling. No visible canal hematoma. Disc levels: C5-6 disc narrowing and ridging. Upper right cervical facet spurring. Upper chest: No acute finding. Mosaic attenuation in the upper lungs attributed to air trapping in the setting of cystic fibrosis history. IMPRESSION: No evidence of acute intracranial or cervical spine injury. Electronically Signed   By: Monte Fantasia M.D.   On: 02/28/2019 05:41   Ct Cervical Spine Wo Contrast  Result Date: 02/28/2019 CLINICAL DATA:  Fall with head injury EXAM: CT HEAD WITHOUT CONTRAST CT CERVICAL SPINE WITHOUT CONTRAST TECHNIQUE: Multidetector CT imaging of the head and cervical spine was performed following the standard  protocol without intravenous contrast. Multiplanar CT image reconstructions of the cervical spine were also generated. COMPARISON:  None. FINDINGS: CT HEAD FINDINGS Brain: No evidence of acute infarction, hemorrhage, hydrocephalus, extra-axial collection or mass lesion/mass effect. Vascular: No hyperdense vessel or unexpected calcification. Skull: Normal. Negative for fracture or focal lesion. Sinuses/Orbits: Negative CT CERVICAL SPINE FINDINGS Alignment: No traumatic malalignment Skull base and vertebrae: Negative for acute fracture. Right-sided cervical rib Soft tissues and spinal canal: No prevertebral fluid or swelling. No visible canal hematoma. Disc levels: C5-6 disc narrowing and ridging. Upper right cervical facet spurring. Upper chest: No acute finding. Mosaic attenuation in the upper lungs attributed to air trapping in the setting of cystic fibrosis history. IMPRESSION: No evidence of acute intracranial or cervical spine injury. Electronically Signed   By: Marnee SpringJonathon  Watts M.D.   On: 02/28/2019 05:41    Procedures Procedures (including critical care time)  Medications Ordered in ED Medications - No data to display   Initial Impression / Assessment and Plan /  ED Course  I have reviewed the triage vital signs and the nursing notes.  Pertinent labs & imaging results that were available during my care of the patient were reviewed by me and considered in my medical decision making (see chart for details).  38 y.o. F here after a fall.  She was drinking with friend and slipped in the shower, struck left elbow and head on shower Simonetti.  No LOC.  Patient initially had some confusion with EMS, however AAOx4 here.  She has c-collar in place.  Some swelling of left elbow over olecranon bursa but no acute deformity.  Moving arm easily without issue.  Imaging negative.  c-collar was removed, ranging her neck without difficulty.  Feel she is stable for discharge home-- sober friend is giving her a ride home.  Recommended NSAIDs for elbow pain along with ice.  Can follow-up with PCP.  Return here for any new/acute changes.  Final Clinical Impressions(s) / ED Diagnoses   Final diagnoses:  Fall in (into) shower or empty bathtub, initial encounter    ED Discharge Orders    None       Garlon HatchetSanders, Barth Trella M, PA-C 02/28/19 [id number]    Ward, Layla MawKristen N, DO 02/28/19 [phone]

## 2019-11-13 ENCOUNTER — Emergency Department (HOSPITAL_COMMUNITY)
Admission: EM | Admit: 2019-11-13 | Discharge: 2019-11-14 | Disposition: A | Discharge status: Home / Self Care | Payer: Self-pay | Attending: Emergency Medicine | Admitting: Emergency Medicine

## 2019-11-13 ENCOUNTER — Encounter (HOSPITAL_COMMUNITY): Payer: Self-pay

## 2019-11-13 ENCOUNTER — Other Ambulatory Visit: Payer: Self-pay

## 2019-11-13 DIAGNOSIS — F17210 Nicotine dependence, cigarettes, uncomplicated: Secondary | ICD-10-CM | POA: Insufficient documentation

## 2019-11-13 DIAGNOSIS — R197 Diarrhea, unspecified: Secondary | ICD-10-CM | POA: Insufficient documentation

## 2019-11-13 DIAGNOSIS — R112 Nausea with vomiting, unspecified: Secondary | ICD-10-CM | POA: Insufficient documentation

## 2019-11-13 LAB — COMPREHENSIVE METABOLIC PANEL
ALT: 118 U/L — ABNORMAL HIGH (ref 0–44)
AST: 123 U/L — ABNORMAL HIGH (ref 15–41)
Albumin: 3.8 g/dL (ref 3.5–5.0)
Alkaline Phosphatase: 99 U/L (ref 38–126)
Anion gap: 10 (ref 5–15)
BUN: 14 mg/dL (ref 6–20)
CO2: 23 mmol/L (ref 22–32)
Calcium: 8.7 mg/dL — ABNORMAL LOW (ref 8.9–10.3)
Chloride: 105 mmol/L (ref 98–111)
Creatinine, Ser: 0.69 mg/dL (ref 0.44–1.00)
GFR calc Af Amer: >60 mL/min (ref >60)
GFR calc non Af Amer: >60 mL/min (ref >60)
Glucose, Bld: 125 mg/dL — ABNORMAL HIGH (ref 70–99)
Potassium: 3.7 mmol/L (ref 3.5–5.1)
Sodium: 138 mmol/L (ref 135–145)
Total Bilirubin: 0.8 mg/dL (ref 0.3–1.2)
Total Protein: 7.2 g/dL (ref 6.5–8.1)

## 2019-11-13 LAB — CBC WITH DIFFERENTIAL/PLATELET
Abs Immature Granulocytes: 0.03 10*3/uL (ref 0.00–0.07)
Basophils Absolute: 0 10*3/uL (ref 0.0–0.1)
Basophils Relative: 0 %
Eosinophils Absolute: 0.1 10*3/uL (ref 0.0–0.5)
Eosinophils Relative: 1 %
HCT: 47.7 % — ABNORMAL HIGH (ref 36.0–46.0)
Hemoglobin: 16 g/dL — ABNORMAL HIGH (ref 12.0–15.0)
Immature Granulocytes: 1 %
Lymphocytes Relative: 5 %
Lymphs Abs: 0.3 10*3/uL — ABNORMAL LOW (ref 0.7–4.0)
MCH: 31.7 pg (ref 26.0–34.0)
MCHC: 33.5 g/dL (ref 30.0–36.0)
MCV: 94.5 fL (ref 80.0–100.0)
Monocytes Absolute: 0.2 10*3/uL (ref 0.1–1.0)
Monocytes Relative: 3 %
Neutro Abs: 5.4 10*3/uL (ref 1.7–7.7)
Neutrophils Relative %: 90 %
Platelets: 282 10*3/uL (ref 150–400)
RBC: 5.05 MIL/uL (ref 3.87–5.11)
RDW: 12.9 % (ref 11.5–15.5)
WBC: 6 10*3/uL (ref 4.0–10.5)
nRBC: 0 % (ref 0.0–0.2)

## 2019-11-13 MED ORDER — KETOROLAC TROMETHAMINE 30 MG/ML IJ SOLN
30.0000 mg | Freq: Once | INTRAMUSCULAR | Status: AC
  Administered 2019-11-13: 30 mg via INTRAVENOUS
  Filled 2019-11-13: qty 1

## 2019-11-13 MED ORDER — ONDANSETRON HCL 4 MG/2ML IJ SOLN
4.0000 mg | Freq: Once | INTRAMUSCULAR | Status: AC
  Administered 2019-11-13: 4 mg via INTRAVENOUS
  Filled 2019-11-13: qty 2

## 2019-11-13 MED ORDER — LORAZEPAM 2 MG/ML IJ SOLN
0.5000 mg | Freq: Once | INTRAMUSCULAR | Status: AC
  Administered 2019-11-13: 22:00:00 0.5 mg via INTRAVENOUS
  Filled 2019-11-13: qty 1

## 2019-11-13 MED ORDER — SODIUM CHLORIDE 0.9 % IV BOLUS (SEPSIS)
2000.0000 mL | Freq: Once | INTRAVENOUS | Status: AC
  Administered 2019-11-13: 2000 mL via INTRAVENOUS

## 2019-11-13 NOTE — ED Triage Notes (Signed)
Per EMS, patient c/o N/V/D starting this morning. Patient states that her entire household has been having similar symptoms. Patient vomited about 4 times with EMS. EMS gave 4mg  IV Zofran, which patient states helped a little, but is still vomiting.

## 2019-11-14 MED ORDER — LOPERAMIDE HCL 2 MG PO CAPS: 4.0000 mg | ORAL_CAPSULE | Freq: Once | ORAL | Status: DC

## 2019-11-14 MED ORDER — SODIUM CHLORIDE 0.9 % IV BOLUS (SEPSIS): 1000.0000 mL | Freq: Once | INTRAVENOUS | Status: DC

## 2019-11-14 MED ORDER — ONDANSETRON HCL 4 MG PO TABS: ORAL_TABLET | ORAL | 0 refills | Status: DC

## 2019-11-14 NOTE — Discharge Instructions (Addendum)
Take Imodium for diarrhea drink plenty of fluids and follow-up if not getting better

## 2019-11-14 NOTE — ED Provider Notes (Signed)
[facility]-EMERGENCY DEPT Provider Note   CSN: [id number] Arrival date & time: 11/13/19  2109     History Chief Complaint  Patient presents with  . Nausea  . Emesis  . Diarrhea    Marisa Palmer is a 39 y.o. female.  Patient complaining of vomiting and diarrhea but no blood in her vomitus or diarrhea.  Patient states that the whole family has been having this.  Mild abdominal cramping  The history is provided by the patient. No language interpreter was used.  Emesis Severity:  Moderate Timing:  Constant Quality:  Bilious material Able to tolerate:  Liquids Progression:  Worsening Chronicity:  New Context: not post-tussive   Relieved by:  Nothing Worsened by:  Nothing Ineffective treatments:  None tried Associated symptoms: diarrhea   Associated symptoms: no abdominal pain, no cough and no headaches   Diarrhea Associated symptoms: vomiting   Associated symptoms: no abdominal pain and no headaches        Past Medical History:  Diagnosis Date  . Back pain, chronic   . Coccygeal fracture (HCC)   . Cystic fibrosis   . DDD (degenerative disc disease)   . Ovarian cyst   . Substance abuse (HCC)   . Thoracic compression fracture (HCC)     There are no problems to display for this patient.   Past Surgical History:  Procedure Laterality Date  . LAPAROSCOPY    . OVARIAN CYST REMOVAL    . TUBAL LIGATION       OB History   No obstetric history on file.     Family History  Problem Relation Age of Onset  . Heart failure Mother   . Diabetes Mother   . Hypertension Father   . Heart failure Other   . Diabetes Other   . Cancer Other     Social History   Tobacco Use  . Smoking status: Current Every Day Smoker    Packs/day: 1.00    Years: 10.00    Pack years: 10.00    Types: Cigarettes  . Smokeless tobacco: Never Used  Substance Use Topics  . Alcohol use: Yes    Comment: occasional  . Drug use: Yes    Types: Morphine, Oxycodone,  Hydrocodone, Marijuana    Comment: opiate    Home Medications Prior to Admission medications   Medication Sig Start Date End Date Taking? Authorizing Provider  LORazepam (ATIVAN) 1 MG tablet Take 1 tablet (1 mg total) by mouth every 6 (six) hours as needed for anxiety. Patient not taking: Reported on 02/28/2019 04/23/15   Janne Napoleon, NP  meloxicam (MOBIC) 15 MG tablet Take 1 tablet (15 mg total) by mouth daily. Patient not taking: Reported on 04/23/2015 01/10/14   Emilia Beck, PA-C  methocarbamol (ROBAXIN) 500 MG tablet Take 1 tablet (500 mg total) by mouth 2 (two) times daily as needed for muscle spasms. Patient not taking: Reported on 04/23/2015 01/10/14   Emilia Beck, PA-C  ondansetron [facility]) 4 MG tablet Take one every 6 hours for pain 11/14/19   Bethann Berkshire, MD    Allergies    Compazine [prochlorperazine] and Phenergan [promethazine hcl]  Review of Systems   Review of Systems  Constitutional: Negative for appetite change and fatigue.  HENT: Negative for congestion, ear discharge and sinus pressure.   Eyes: Negative for discharge.  Respiratory: Negative for cough.   Cardiovascular: Negative for chest pain.  Gastrointestinal: Positive for diarrhea and vomiting. Negative for abdominal pain.  Genitourinary: Negative  for frequency and hematuria.  Musculoskeletal: Negative for back pain.  Skin: Negative for rash.  Neurological: Negative for seizures and headaches.  Psychiatric/Behavioral: Negative for hallucinations.    Physical Exam Updated Vital Signs BP 119/74   Pulse (!) 108   Temp 100 F (37.8 C) (Oral)   Resp 18   Ht 5\' 6"  (1.676 m)   Wt 81.6 kg   SpO2 97%   BMI 29.05 kg/m   Physical Exam Vitals and nursing note reviewed.  Constitutional:      Appearance: She is well-developed.  HENT:     Head: Normocephalic.     Nose: Nose normal.  Eyes:     General: No scleral icterus.    Conjunctiva/sclera: Conjunctivae normal.  Neck:     Thyroid: No  thyromegaly.  Cardiovascular:     Rate and Rhythm: Normal rate and regular rhythm.     Heart sounds: No murmur. No friction rub. No gallop.   Pulmonary:     Breath sounds: No stridor. No wheezing or rales.  Chest:     Chest Harkin: No tenderness.  Abdominal:     General: There is no distension.     Tenderness: There is no abdominal tenderness. There is no rebound.  Musculoskeletal:        General: Normal range of motion.     Cervical back: Neck supple.  Lymphadenopathy:     Cervical: No cervical adenopathy.  Skin:    Findings: No erythema or rash.  Neurological:     Mental Status: She is alert and oriented to person, place, and time.     Motor: No abnormal muscle tone.     Coordination: Coordination normal.  Psychiatric:        Behavior: Behavior normal.     ED Results / Procedures / Treatments   Labs (all labs ordered are listed, but only abnormal results are displayed) Labs Reviewed  CBC WITH DIFFERENTIAL/PLATELET - Abnormal; Notable for the following components:      Result Value   Hemoglobin 16.0 (*)    HCT 47.7 (*)    Lymphs Abs 0.3 (*)    All other components within normal limits  COMPREHENSIVE METABOLIC PANEL - Abnormal; Notable for the following components:   Glucose, Bld 125 (*)    Calcium 8.7 (*)    AST 123 (*)    ALT 118 (*)    All other components within normal limits    EKG None  Radiology No results found.  Procedures Procedures (including critical care time)  Medications Ordered in ED Medications  sodium chloride 0.9 % bolus 1,000 mL (has no administration in time range)  loperamide (IMODIUM) capsule 4 mg (has no administration in time range)  sodium chloride 0.9 % bolus 2,000 mL (2,000 mLs Intravenous New Bag/Given 11/13/19 2210)  ondansetron (ZOFRAN) injection 4 mg (4 mg Intravenous Given 11/13/19 2211)  ketorolac (TORADOL) 30 MG/ML injection 30 mg (30 mg Intravenous Given 11/13/19 2211)  LORazepam (ATIVAN) injection 0.5 mg (0.5 mg Intravenous  Given 11/13/19 2211)    ED Course  I have reviewed the triage vital signs and the nursing notes.  Pertinent labs & imaging results that were available during my care of the patient were reviewed by me and considered in my medical decision making (see chart for details).    MDM Rules/Calculators/A&P                      Patient with gastroenteritis with dehydration.  She is  sent home with Zofran and told to take Imodium and follow-up with the primary care doctor    This patient presents to the ED for concern of vomiting and diarrhea this involves an extensive number of treatment options, and is a complaint that carries with it a high risk of complications and morbidity.  The differential diagnosis includes gastroenteritis ischemic bowel appendicitis   Lab Tests:   I Ordered, reviewed, and interpreted labs, which included CBC and chemistry and liver functions.  Labs unremarkable except for mild elevation of a couple liver enzymes  Medicines ordered:   I ordered medication Zofran for nausea and plenty of fluids for dehydration  Imaging Studies ordered:  Additional history obtained:   Additional history obtained from record  Previous records obtained and reviewed   Consultations Obtained:   Reevaluation:  After the interventions stated above, I reevaluated the patient and found improved with nausea medicine and fluids Critical Interventions:  .   Final Clinical Impression(s) / ED Diagnoses Final diagnoses:  Nausea vomiting and diarrhea    Rx / DC Orders ED Discharge Orders         Ordered    ondansetron (ZOFRAN) 4 MG tablet     11/14/19 0038           Milton Ferguson, MD 11/14/19 0041

## 2019-12-17 DIAGNOSIS — F339 Major depressive disorder, recurrent, unspecified: Secondary | ICD-10-CM | POA: Insufficient documentation

## 2019-12-17 DIAGNOSIS — F411 Generalized anxiety disorder: Secondary | ICD-10-CM | POA: Diagnosis present

## 2019-12-17 DIAGNOSIS — F439 Reaction to severe stress, unspecified: Secondary | ICD-10-CM | POA: Diagnosis present

## 2020-08-17 ENCOUNTER — Ambulatory Visit (HOSPITAL_COMMUNITY): Payer: No Payment, Other | Admitting: Psychiatry

## 2020-08-17 ENCOUNTER — Ambulatory Visit (HOSPITAL_COMMUNITY): Payer: No Payment, Other | Admitting: Licensed Clinical Social Worker

## 2020-08-21 ENCOUNTER — Ambulatory Visit (HOSPITAL_COMMUNITY): Payer: No Payment, Other | Admitting: Physician Assistant

## 2020-09-25 ENCOUNTER — Ambulatory Visit (HOSPITAL_COMMUNITY): Payer: No Payment, Other | Admitting: Physician Assistant

## 2021-01-14 ENCOUNTER — Emergency Department (HOSPITAL_COMMUNITY): Payer: Self-pay

## 2021-01-14 ENCOUNTER — Encounter (HOSPITAL_COMMUNITY): Payer: Self-pay | Admitting: Emergency Medicine

## 2021-01-14 ENCOUNTER — Other Ambulatory Visit: Payer: Self-pay

## 2021-01-14 ENCOUNTER — Inpatient Hospital Stay (HOSPITAL_COMMUNITY)
Admission: EM | Admit: 2021-01-14 | Discharge: 2021-01-16 | DRG: 439 | Disposition: A | Discharge status: Home / Self Care | Payer: Self-pay | Attending: Family Medicine | Admitting: Family Medicine

## 2021-01-14 DIAGNOSIS — F1110 Opioid abuse, uncomplicated: Secondary | ICD-10-CM | POA: Diagnosis present

## 2021-01-14 DIAGNOSIS — Z87891 Personal history of nicotine dependence: Secondary | ICD-10-CM

## 2021-01-14 DIAGNOSIS — K852 Alcohol induced acute pancreatitis without necrosis or infection: Principal | ICD-10-CM | POA: Diagnosis present

## 2021-01-14 DIAGNOSIS — Z888 Allergy status to other drugs, medicaments and biological substances status: Secondary | ICD-10-CM

## 2021-01-14 DIAGNOSIS — Z79899 Other long term (current) drug therapy: Secondary | ICD-10-CM

## 2021-01-14 DIAGNOSIS — Z20822 Contact with and (suspected) exposure to covid-19: Secondary | ICD-10-CM | POA: Diagnosis present

## 2021-01-14 DIAGNOSIS — Z833 Family history of diabetes mellitus: Secondary | ICD-10-CM

## 2021-01-14 DIAGNOSIS — F1010 Alcohol abuse, uncomplicated: Secondary | ICD-10-CM | POA: Diagnosis present

## 2021-01-14 DIAGNOSIS — Z8249 Family history of ischemic heart disease and other diseases of the circulatory system: Secondary | ICD-10-CM

## 2021-01-14 LAB — URINALYSIS, ROUTINE W REFLEX MICROSCOPIC
Bilirubin Urine: NEGATIVE
Glucose, UA: NEGATIVE mg/dL
Hgb urine dipstick: NEGATIVE
Ketones, ur: NEGATIVE mg/dL
Leukocytes,Ua: NEGATIVE
Nitrite: POSITIVE — AB
Protein, ur: NEGATIVE mg/dL
Specific Gravity, Urine: 1.018 (ref 1.005–1.030)
pH: 5 (ref 5.0–8.0)

## 2021-01-14 LAB — COMPREHENSIVE METABOLIC PANEL
ALT: 51 U/L — ABNORMAL HIGH (ref 0–44)
AST: 67 U/L — ABNORMAL HIGH (ref 15–41)
Albumin: 3.8 g/dL (ref 3.5–5.0)
Alkaline Phosphatase: 267 U/L — ABNORMAL HIGH (ref 38–126)
Anion gap: 11 (ref 5–15)
BUN: 5 mg/dL — ABNORMAL LOW (ref 6–20)
CO2: 24 mmol/L (ref 22–32)
Calcium: 9.4 mg/dL (ref 8.9–10.3)
Chloride: 100 mmol/L (ref 98–111)
Creatinine, Ser: 0.59 mg/dL (ref 0.44–1.00)
GFR, Estimated: >60 mL/min (ref >60)
Glucose, Bld: 108 mg/dL — ABNORMAL HIGH (ref 70–99)
Potassium: 3.6 mmol/L (ref 3.5–5.1)
Sodium: 135 mmol/L (ref 135–145)
Total Bilirubin: 0.8 mg/dL (ref 0.3–1.2)
Total Protein: 8.3 g/dL — ABNORMAL HIGH (ref 6.5–8.1)

## 2021-01-14 LAB — CBC
HCT: 43.1 % (ref 36.0–46.0)
Hemoglobin: 14.7 g/dL (ref 12.0–15.0)
MCH: 34.9 pg — ABNORMAL HIGH (ref 26.0–34.0)
MCHC: 34.1 g/dL (ref 30.0–36.0)
MCV: 102.4 fL — ABNORMAL HIGH (ref 80.0–100.0)
Platelets: 466 10*3/uL — ABNORMAL HIGH (ref 150–400)
RBC: 4.21 MIL/uL (ref 3.87–5.11)
RDW: 14.6 % (ref 11.5–15.5)
WBC: 15.9 10*3/uL — ABNORMAL HIGH (ref 4.0–10.5)
nRBC: 0 % (ref 0.0–0.2)

## 2021-01-14 LAB — LIPASE, BLOOD: Lipase: 328 U/L — ABNORMAL HIGH (ref 11–51)

## 2021-01-14 LAB — I-STAT BETA HCG BLOOD, ED (MC, WL, AP ONLY): I-stat hCG, quantitative: <5 m[IU]/mL (ref <5)

## 2021-01-14 MED ORDER — ONDANSETRON 4 MG PO TBDP
4.0000 mg | ORAL_TABLET | Freq: Once | ORAL | Status: AC
  Administered 2021-01-14: 4 mg via ORAL
  Filled 2021-01-14: qty 1

## 2021-01-14 MED ORDER — HYDROMORPHONE HCL 1 MG/ML IJ SOLN
1.0000 mg | Freq: Once | INTRAMUSCULAR | Status: AC
  Administered 2021-01-14: 1 mg via INTRAVENOUS
  Filled 2021-01-14: qty 1

## 2021-01-14 MED ORDER — ONDANSETRON HCL 4 MG/2ML IJ SOLN
4.0000 mg | Freq: Once | INTRAMUSCULAR | Status: AC
  Administered 2021-01-14: 4 mg via INTRAVENOUS
  Filled 2021-01-14: qty 2

## 2021-01-14 MED ORDER — IOHEXOL 300 MG/ML  SOLN
100.0000 mL | Freq: Once | INTRAMUSCULAR | Status: AC | PRN
  Administered 2021-01-14: 100 mL via INTRAVENOUS

## 2021-01-14 NOTE — ED Provider Notes (Signed)
Emergency Medicine Provider Triage Evaluation Note  EZMAE SPEERS , a 40 y.o. female  was evaluated in triage.  Pt complains of upper abd pain.  Review of Systems  Positive: Upper abd pain, nausea, vomiting Negative: Fever, cp, sob, dysuria, vaginal bleeding  Physical Exam  BP (!) 145/112 (BP Location: Left Arm)   Pulse (!) 110   Temp 98.3 F (36.8 C) (Oral)   Resp 16   LMP 12/10/2020 (Approximate) Comment: States she's been skipping periods; tubes tied  SpO2 98%  Gen:   Awake, appears uncomfortable Resp:  Normal effort  MSK:   Moves extremities without difficulty  Other:  TTP RUQ and epigastric  Medical Decision Making  Medically screening exam initiated at 5:48 PM.  Appropriate orders placed.  Roshawna Colclasure Janeway was informed that the remainder of the evaluation will be completed by another provider, this initial triage assessment does not replace that evaluation, and the importance of remaining in the ED until their evaluation is complete.  Pt report recurrent upper abd pain x 1 year, worsening in the past few weeks.  Report nausea, afraid to eat, weight loss.  Has intact gallbladder.  Does drink wine several times weekly.    Fayrene Helper, PA-C 01/14/21 1800    Horton, Clabe Seal, DO 01/14/21 2225

## 2021-01-14 NOTE — ED Provider Notes (Signed)
[facility]-EMERGENCY DEPT Provider Note   CSN: [id number] Arrival date & time: 01/14/21  1725     History Chief Complaint  Patient presents with   Abdominal Pain    Marisa Palmer is a 40 y.o. female.  Patient to ED with upper abdominal pain that started 2 days ago and has been constant. It is associated with nausea and vomiting. She reports she has been unable to eat or drink. She has had similar pain intermittently over the last year without pursuing medical evaluation. No diarrhea. No previous abdominal surgeries. No respiratory symptoms. No known fever.  The history is provided by the patient. No language interpreter was used.  Abdominal Pain Associated symptoms: nausea and vomiting   Associated symptoms: no diarrhea and no fever       Past Medical History:  Diagnosis Date   Back pain, chronic    Coccygeal fracture (HCC)    Cystic fibrosis    DDD (degenerative disc disease)    Ovarian cyst    Substance abuse (HCC)    Thoracic compression fracture (HCC)     There are no problems to display for this patient.   Past Surgical History:  Procedure Laterality Date   LAPAROSCOPY     OVARIAN CYST REMOVAL     TUBAL LIGATION       OB History   No obstetric history on file.     Family History  Problem Relation Age of Onset   Heart failure Mother    Diabetes Mother    Hypertension Father    Heart failure Other    Diabetes Other    Cancer Other     Social History   Tobacco Use   Smoking status: Every Day    Packs/day: 1.00    Years: 10.00    Pack years: 10.00    Types: Cigarettes   Smokeless tobacco: Never  Substance Use Topics   Alcohol use: Yes    Comment: occasional   Drug use: Yes    Types: Morphine, Oxycodone, Hydrocodone, Marijuana    Comment: opiate    Home Medications Prior to Admission medications   Medication Sig Start Date End Date Taking? Authorizing Provider  LORazepam (ATIVAN) 1 MG tablet Take 1 tablet (1 mg total)  by mouth every 6 (six) hours as needed for anxiety. Patient not taking: Reported on 02/28/2019 04/23/15   Janne Napoleon, NP  meloxicam (MOBIC) 15 MG tablet Take 1 tablet (15 mg total) by mouth daily. Patient not taking: Reported on 04/23/2015 01/10/14   Emilia Beck, PA-C  methocarbamol (ROBAXIN) 500 MG tablet Take 1 tablet (500 mg total) by mouth 2 (two) times daily as needed for muscle spasms. Patient not taking: Reported on 04/23/2015 01/10/14   Emilia Beck, PA-C  ondansetron [facility]) 4 MG tablet Take one every 6 hours for pain 11/14/19   Bethann Berkshire, MD    Allergies    Compazine [prochlorperazine] and Phenergan [promethazine hcl]  Review of Systems   Review of Systems  Constitutional:  Negative for fever.  HENT: Negative.    Respiratory: Negative.    Cardiovascular: Negative.   Gastrointestinal:  Positive for abdominal pain, nausea and vomiting. Negative for diarrhea.  Genitourinary: Negative.   Musculoskeletal:  Negative for myalgias.  Skin: Negative.   Neurological:  Negative for weakness.   Physical Exam Updated Vital Signs BP (!) 151/94   Pulse 86   Temp 99.3 F (37.4 C) (Oral)   Resp 18   LMP 12/10/2020 (Approximate)  Comment: States she's been skipping periods; tubes tied  SpO2 100%   Physical Exam Vitals and nursing note reviewed.  Constitutional:      Appearance: Normal appearance. She is well-developed. She is obese. She is not toxic-appearing.     Comments: Uncomfortable appearing, tearful.  HENT:     Head: Normocephalic.  Cardiovascular:     Rate and Rhythm: Normal rate and regular rhythm.     Heart sounds: No murmur heard. Pulmonary:     Effort: Pulmonary effort is normal.     Breath sounds: Normal breath sounds. No wheezing, rhonchi or rales.  Abdominal:     Palpations: Abdomen is soft.     Tenderness: There is abdominal tenderness (RUQ and epigastric tenderness.  Soft abdomen.). There is no guarding or rebound.  Musculoskeletal:         General: Normal range of motion.     Cervical back: Normal range of motion and neck supple.  Skin:    General: Skin is warm and dry.  Neurological:     General: No focal deficit present.     Mental Status: She is alert and oriented to person, place, and time.    ED Results / Procedures / Treatments   Labs (all labs ordered are listed, but only abnormal results are displayed) Labs Reviewed  LIPASE, BLOOD - Abnormal; Notable for the following components:      Result Value   Lipase 328 (*)    All other components within normal limits  COMPREHENSIVE METABOLIC PANEL - Abnormal; Notable for the following components:   Glucose, Bld 108 (*)    BUN 5 (*)    Total Protein 8.3 (*)    AST 67 (*)    ALT 51 (*)    Alkaline Phosphatase 267 (*)    All other components within normal limits  CBC - Abnormal; Notable for the following components:   WBC 15.9 (*)    MCV 102.4 (*)    MCH 34.9 (*)    Platelets 466 (*)    All other components within normal limits  URINALYSIS, ROUTINE W REFLEX MICROSCOPIC  I-STAT BETA HCG BLOOD, ED ([facility], [facility], [facility] ONLY)   Results for orders placed or performed during the hospital encounter of 01/14/21  Lipase, blood  Result Value Ref Range   Lipase 328 (H) 11 - 51 U/L  Comprehensive metabolic panel  Result Value Ref Range   Sodium 135 135 - 145 mmol/L   Potassium 3.6 3.5 - 5.1 mmol/L   Chloride 100 98 - 111 mmol/L   CO2 24 22 - 32 mmol/L   Glucose, Bld 108 (H) 70 - 99 mg/dL   BUN 5 (L) 6 - 20 mg/dL   Creatinine, Ser 1.61 0.44 - 1.00 mg/dL   Calcium 9.4 8.9 - 09.6 mg/dL   Total Protein 8.3 (H) 6.5 - 8.1 g/dL   Albumin 3.8 3.5 - 5.0 g/dL   AST 67 (H) 15 - 41 U/L   ALT 51 (H) 0 - 44 U/L   Alkaline Phosphatase 267 (H) 38 - 126 U/L   Total Bilirubin 0.8 0.3 - 1.2 mg/dL   GFR, Estimated >04 >54 mL/min   Anion gap 11 5 - 15  CBC  Result Value Ref Range   WBC 15.9 (H) 4.0 - 10.5 K/uL   RBC 4.21 3.87 - 5.11 MIL/uL   Hemoglobin 14.7 12.0 - 15.0 g/dL   HCT 09.8  11.9 - 14.7 %   MCV 102.4 (H) 80.0 - 100.0 fL  MCH 34.9 (H) 26.0 - 34.0 pg   MCHC 34.1 30.0 - 36.0 g/dL   RDW 41.2 87.8 - 67.6 %   Platelets 466 (H) 150 - 400 K/uL   nRBC 0.0 0.0 - 0.2 %  I-Stat beta hCG blood, ED  Result Value Ref Range   I-stat hCG, quantitative <5.0 <5 mIU/mL   Comment 3            EKG None  Radiology US Abdomen Limited  Result Date: 01/14/2021 CLINICAL DATA:  Right upper quadrant pain EXAM: ULTRASOUND ABDOMEN LIMITED RIGHT UPPER QUADRANT COMPARISON:  None. FINDINGS: Gallbladder: Gallbladder is well distended. Negative sonographic Murphy's sign is noted. No Sanderlin thickening is seen. Common bile duct: Diameter: 5.5 mm. Liver: Diffusely heterogeneous without discrete mass. These changes may be related to hepatic inflammatory change. Acoustical window is not optimum. Portal vein is patent on color Doppler imaging with normal direction of blood flow towards the liver. Other: None. IMPRESSION: Diffuse heterogeneity in the liver without focal mass although the acoustical window with not optimal. CT with contrast is recommended for further evaluation. No focal abnormality in the gallbladder. Electronically Signed   By: Alcide Clever M.D.   On: 01/14/2021 19:28    Procedures Procedures   Medications Ordered in ED Medications  HYDROmorphone (DILAUDID) injection 1 mg (has no administration in time range)  ondansetron (ZOFRAN) injection 4 mg (has no administration in time range)  ondansetron (ZOFRAN-ODT) disintegrating tablet 4 mg (4 mg Oral Given 01/14/21 1810)    ED Course  I have reviewed the triage vital signs and the nursing notes.  Pertinent labs & imaging results that were available during my care of the patient were reviewed by me and considered in my medical decision making (see chart for details).    MDM Rules/Calculators/A&P                          Patient to ED with upper abdominal pain as detailed in HPI.   She appears uncomfortable. No fever. No  vomiting in the ED. Iv pain medication provided with some relief. She denies regular use of NSAIDs, regular use of alcohol.   [place] negative for gall stones. CT ordered and confirms diagnosis of acute pancreatitis. ?early findings of cirrhosis. When patient updated, she admits to alcohol overuse. Per chart review, she has a long history of opioid use. Lipase 328, WBCs 15.9.  Discussed admission with Dr. Julian Reil who will admit for pancreatitis.  Final Clinical Impression(s) / ED Diagnoses Final diagnoses:  None   Acute pancreatitis  Rx / DC Orders ED Discharge Orders     None        Danne Harbor 01/15/21 0253    Lorre Nick, MD 01/16/21 [phone]

## 2021-01-14 NOTE — ED Triage Notes (Signed)
Pt BIB [facility] after abdominal pain off and on x 1 year. N/V. Alert and oriented.

## 2021-01-15 ENCOUNTER — Encounter (HOSPITAL_COMMUNITY): Payer: Self-pay | Admitting: Internal Medicine

## 2021-01-15 DIAGNOSIS — F1010 Alcohol abuse, uncomplicated: Secondary | ICD-10-CM | POA: Diagnosis present

## 2021-01-15 DIAGNOSIS — K852 Alcohol induced acute pancreatitis without necrosis or infection: Secondary | ICD-10-CM | POA: Diagnosis present

## 2021-01-15 LAB — CBC
HCT: 41.3 % (ref 36.0–46.0)
Hemoglobin: 13.7 g/dL (ref 12.0–15.0)
MCH: 34.7 pg — ABNORMAL HIGH (ref 26.0–34.0)
MCHC: 33.2 g/dL (ref 30.0–36.0)
MCV: 104.6 fL — ABNORMAL HIGH (ref 80.0–100.0)
Platelets: 430 10*3/uL — ABNORMAL HIGH (ref 150–400)
RBC: 3.95 MIL/uL (ref 3.87–5.11)
RDW: 14.6 % (ref 11.5–15.5)
WBC: 14.9 10*3/uL — ABNORMAL HIGH (ref 4.0–10.5)
nRBC: 0 % (ref 0.0–0.2)

## 2021-01-15 LAB — COMPREHENSIVE METABOLIC PANEL
ALT: 42 U/L (ref 0–44)
AST: 52 U/L — ABNORMAL HIGH (ref 15–41)
Albumin: 3.5 g/dL (ref 3.5–5.0)
Alkaline Phosphatase: 230 U/L — ABNORMAL HIGH (ref 38–126)
Anion gap: 9 (ref 5–15)
BUN: <5 mg/dL — ABNORMAL LOW (ref 6–20)
CO2: 27 mmol/L (ref 22–32)
Calcium: 9 mg/dL (ref 8.9–10.3)
Chloride: 102 mmol/L (ref 98–111)
Creatinine, Ser: 0.47 mg/dL (ref 0.44–1.00)
GFR, Estimated: >60 mL/min (ref >60)
Glucose, Bld: 105 mg/dL — ABNORMAL HIGH (ref 70–99)
Potassium: 3.7 mmol/L (ref 3.5–5.1)
Sodium: 138 mmol/L (ref 135–145)
Total Bilirubin: 0.9 mg/dL (ref 0.3–1.2)
Total Protein: 7.6 g/dL (ref 6.5–8.1)

## 2021-01-15 LAB — RESP PANEL BY RT-PCR (FLU A&B, COVID) ARPGX2
Influenza A by PCR: NEGATIVE
Influenza B by PCR: NEGATIVE
SARS Coronavirus 2 by RT PCR: NEGATIVE

## 2021-01-15 LAB — MAGNESIUM: Magnesium: 2 mg/dL (ref 1.7–2.4)

## 2021-01-15 LAB — PHOSPHORUS: Phosphorus: 4.7 mg/dL — ABNORMAL HIGH (ref 2.5–4.6)

## 2021-01-15 LAB — HIV ANTIBODY (ROUTINE TESTING W REFLEX): HIV Screen 4th Generation wRfx: NONREACTIVE

## 2021-01-15 MED ORDER — ACETAMINOPHEN 325 MG PO TABS
650.0000 mg | ORAL_TABLET | Freq: Four times a day (QID) | ORAL | Status: DC | PRN
  Administered 2021-01-15 – 2021-01-16 (×2): 650 mg via ORAL
  Filled 2021-01-15 (×2): qty 2

## 2021-01-15 MED ORDER — ADULT MULTIVITAMIN W/MINERALS CH
1.0000 | ORAL_TABLET | Freq: Every day | ORAL | Status: DC
  Administered 2021-01-15 – 2021-01-16 (×2): 1 via ORAL
  Filled 2021-01-15 (×2): qty 1

## 2021-01-15 MED ORDER — ACETAMINOPHEN 650 MG RE SUPP: 650.0000 mg | Freq: Four times a day (QID) | RECTAL | Status: DC | PRN

## 2021-01-15 MED ORDER — DIPHENHYDRAMINE HCL 25 MG PO CAPS
25.0000 mg | ORAL_CAPSULE | Freq: Four times a day (QID) | ORAL | Status: DC | PRN
  Administered 2021-01-15 – 2021-01-16 (×4): 25 mg via ORAL
  Filled 2021-01-15 (×4): qty 1

## 2021-01-15 MED ORDER — SODIUM CHLORIDE 0.9 % IV SOLN: INTRAVENOUS | Status: DC

## 2021-01-15 MED ORDER — ENOXAPARIN SODIUM 40 MG/0.4ML IJ SOSY
40.0000 mg | PREFILLED_SYRINGE | INTRAMUSCULAR | Status: DC
  Administered 2021-01-15 – 2021-01-16 (×2): 40 mg via SUBCUTANEOUS
  Filled 2021-01-15 (×2): qty 0.4

## 2021-01-15 MED ORDER — HYDROMORPHONE HCL 1 MG/ML IJ SOLN
1.0000 mg | Freq: Once | INTRAMUSCULAR | Status: AC
  Administered 2021-01-15: 1 mg via INTRAVENOUS
  Filled 2021-01-15: qty 1

## 2021-01-15 MED ORDER — LIP MEDEX EX OINT
TOPICAL_OINTMENT | CUTANEOUS | Status: DC | PRN
  Administered 2021-01-15: 1 via TOPICAL
  Filled 2021-01-15: qty 7

## 2021-01-15 MED ORDER — FOLIC ACID 1 MG PO TABS
1.0000 mg | ORAL_TABLET | Freq: Every day | ORAL | Status: DC
  Administered 2021-01-15 – 2021-01-16 (×2): 1 mg via ORAL
  Filled 2021-01-15 (×2): qty 1

## 2021-01-15 MED ORDER — LORAZEPAM 2 MG/ML IJ SOLN
1.0000 mg | INTRAMUSCULAR | Status: DC | PRN
Start: 2021-01-15 — End: 2021-01-16
  Administered 2021-01-16: 1 mg via INTRAVENOUS
  Filled 2021-01-15: qty 1

## 2021-01-15 MED ORDER — THIAMINE HCL 100 MG/ML IJ SOLN: 100.0000 mg | Freq: Every day | INTRAMUSCULAR | Status: DC

## 2021-01-15 MED ORDER — HYDROMORPHONE HCL 1 MG/ML IJ SOLN
1.0000 mg | INTRAMUSCULAR | Status: DC | PRN
  Administered 2021-01-15 – 2021-01-16 (×12): 1 mg via INTRAVENOUS
  Filled 2021-01-15 (×12): qty 1

## 2021-01-15 MED ORDER — HYDROMORPHONE HCL 1 MG/ML IJ SOLN
0.5000 mg | Freq: Once | INTRAMUSCULAR | Status: AC
  Administered 2021-01-15: 0.5 mg via INTRAVENOUS
  Filled 2021-01-15: qty 1

## 2021-01-15 MED ORDER — LORAZEPAM 1 MG PO TABS
1.0000 mg | ORAL_TABLET | ORAL | Status: DC | PRN
  Administered 2021-01-16: 1 mg via ORAL
  Filled 2021-01-15: qty 1

## 2021-01-15 MED ORDER — ONDANSETRON HCL 4 MG/2ML IJ SOLN: 4.0000 mg | Freq: Four times a day (QID) | INTRAMUSCULAR | Status: DC | PRN

## 2021-01-15 MED ORDER — ONDANSETRON HCL 4 MG PO TABS: 4.0000 mg | ORAL_TABLET | Freq: Four times a day (QID) | ORAL | Status: DC | PRN

## 2021-01-15 MED ORDER — THIAMINE HCL 100 MG PO TABS
100.0000 mg | ORAL_TABLET | Freq: Every day | ORAL | Status: DC
  Administered 2021-01-15 – 2021-01-16 (×2): 100 mg via ORAL
  Filled 2021-01-15 (×2): qty 1

## 2021-01-15 NOTE — Plan of Care (Signed)
Plan of care discussed.   

## 2021-01-15 NOTE — Care Plan (Signed)
This 40 years old female with PMH significant for opiate abuse from the age of 61 until last year (states she has been clean from opioids for last 2 years) but does admit drinking alcohol.  She presented to the ED with complaints of epigastric pain radiating towards the back for 2 days.  Lipase 325, CT abdomen consistent with acute pancreatitis.  Patient is admitted on the medical floor, continue bowel rest, n.p.o., IV fluids IV , pain medications.  We will continue to trend lipase.  Continue CIWA protocol for EtOH use.  Patient was seen and examined at bedside.

## 2021-01-15 NOTE — H&P (Signed)
History and Physical    Marisa Palmer:[id number] DOB: 01-Nov-1980 DOA: 01/14/2021  PCP: Patient, No Pcp Per (Inactive)  Patient coming from: Home  I have personally briefly reviewed patient's old medical records in [facility]  Chief Complaint: Abd pain  HPI: Marisa Palmer is a 40 y.o. female with medical history significant of opiate abuse from age 59 until the past year or two (states she has been clean from opiate perspective for a while now).  Pt does admit to alcohol use / overuse recently.  Pt presents to ED with c/o epigastric abd pain.  Symptoms onset 2 days ago.  Pain is severe, worsening, constant.  Associated N/V.  Unable to take POs.  Similar pain intermittently over the past year without pursuing medical evaluation.  No diarrhea.  No fever.   ED Course: Pt with acute pancreatitis demonstrated on CT scan, lipase of 328.  No gall stones.  Pt admits that EtOH probably related.   Review of Systems: As per HPI, otherwise all review of systems negative.  Past Medical History:  Diagnosis Date   Back pain, chronic    Coccygeal fracture (HCC)    Cystic fibrosis    DDD (degenerative disc disease)    Ovarian cyst    Substance abuse (HCC)    Thoracic compression fracture (HCC)     Past Surgical History:  Procedure Laterality Date   LAPAROSCOPY     OVARIAN CYST REMOVAL     TUBAL LIGATION       reports that she has been smoking cigarettes. She has a 10.00 pack-year smoking history. She has never used smokeless tobacco. She reports current alcohol use. She reports previous drug use. Drugs: Morphine, Oxycodone, Hydrocodone, and Marijuana.  Allergies  Allergen Reactions   Compazine [Prochlorperazine] Anxiety   Phenergan [Promethazine Hcl] Anxiety    Family History  Problem Relation Age of Onset   Heart failure Mother    Diabetes Mother    Hypertension Father    Heart failure Other    Diabetes Other    Cancer Other      Prior to Admission medications    Medication Sig Start Date End Date Taking? Authorizing Provider  LORazepam (ATIVAN) 1 MG tablet Take 1 tablet (1 mg total) by mouth every 6 (six) hours as needed for anxiety. Patient not taking: Reported on 02/28/2019 04/23/15   Janne Napoleon, NP  meloxicam (MOBIC) 15 MG tablet Take 1 tablet (15 mg total) by mouth daily. Patient not taking: Reported on 04/23/2015 01/10/14   Emilia Beck, PA-C  methocarbamol (ROBAXIN) 500 MG tablet Take 1 tablet (500 mg total) by mouth 2 (two) times daily as needed for muscle spasms. Patient not taking: Reported on 04/23/2015 01/10/14   Emilia Beck, PA-C  ondansetron [facility]) 4 MG tablet Take one every 6 hours for pain 11/14/19   Bethann Berkshire, MD    Physical Exam: Vitals:   01/15/21 0130 01/15/21 0215 01/15/21 0300 01/15/21 0502  BP: 137/83 (!) 147/98 124/70 140/86  Pulse: (!) 101 (!) 107 (!) 101 95  Resp: 18 18 18 20   Temp:    98.3 F (36.8 C)  TempSrc:    Oral  SpO2: 95% 99% 92% 99%  Weight:    75 kg  Height:    5\' 6"  (1.676 m)    Constitutional: NAD, calm, comfortable Eyes: PERRL, lids and conjunctivae normal ENMT: Mucous membranes are moist. Posterior pharynx clear of any exudate or lesions.Normal dentition.  Neck: normal, supple, no  masses, no thyromegaly Respiratory: clear to auscultation bilaterally, no wheezing, no crackles. Normal respiratory effort. No accessory muscle use.  Cardiovascular: Regular rate and rhythm, no murmurs / rubs / gallops. No extremity edema. 2+ pedal pulses. No carotid bruits.  Abdomen: Epigastric TTP Musculoskeletal: no clubbing / cyanosis. No joint deformity upper and lower extremities. Good ROM, no contractures. Normal muscle tone.  Skin: no rashes, lesions, ulcers. No induration Neurologic: CN 2-12 grossly intact. Sensation intact, DTR normal. Strength 5/5 in all 4.  Psychiatric: Normal judgment and insight. Alert and oriented x 3. Normal mood.    Labs on Admission: I have personally reviewed following  labs and imaging studies  CBC: Recent Labs  Lab 01/14/21 2206 01/15/21 0400  WBC 15.9* 14.9*  HGB 14.7 13.7  HCT 43.1 41.3  MCV 102.4* 104.6*  PLT 466* 430*   Basic Metabolic Panel: Recent Labs  Lab 01/14/21 2206 01/15/21 0400  NA 135 138  K 3.6 3.7  CL 100 102  CO2 24 27  GLUCOSE 108* 105*  BUN 5* <5*  CREATININE 0.59 0.47  CALCIUM 9.4 9.0   GFR: Estimated Creatinine Clearance: 95.8 mL/min (by C-G formula based on SCr of 0.47 mg/dL). Liver Function Tests: Recent Labs  Lab 01/14/21 2206 01/15/21 0400  AST 67* 52*  ALT 51* 42  ALKPHOS 267* 230*  BILITOT 0.8 0.9  PROT 8.3* 7.6  ALBUMIN 3.8 3.5   Recent Labs  Lab 01/14/21 2206  LIPASE 328*   No results for input(s): AMMONIA in the last 168 hours. Coagulation Profile: No results for input(s): INR, PROTIME in the last 168 hours. Cardiac Enzymes: No results for input(s): CKTOTAL, CKMB, CKMBINDEX, TROPONINI in the last 168 hours. BNP (last 3 results) No results for input(s): PROBNP in the last 8760 hours. HbA1C: No results for input(s): HGBA1C in the last 72 hours. CBG: No results for input(s): GLUCAP in the last 168 hours. Lipid Profile: No results for input(s): CHOL, HDL, LDLCALC, TRIG, CHOLHDL, LDLDIRECT in the last 72 hours. Thyroid Function Tests: No results for input(s): TSH, T4TOTAL, FREET4, T3FREE, THYROIDAB in the last 72 hours. Anemia Panel: No results for input(s): VITAMINB12, FOLATE, FERRITIN, TIBC, IRON, RETICCTPCT in the last 72 hours. Urine analysis:    Component Value Date/Time   COLORURINE YELLOW 01/14/2021 2330   APPEARANCEUR CLEAR 01/14/2021 2330   LABSPEC 1.018 01/14/2021 2330   PHURINE 5.0 01/14/2021 2330   GLUCOSEU NEGATIVE 01/14/2021 2330   HGBUR NEGATIVE 01/14/2021 2330   BILIRUBINUR NEGATIVE 01/14/2021 2330   KETONESUR NEGATIVE 01/14/2021 2330   PROTEINUR NEGATIVE 01/14/2021 2330   UROBILINOGEN 1.0 02/08/2013 0422   NITRITE POSITIVE (A) 01/14/2021 2330   LEUKOCYTESUR  NEGATIVE 01/14/2021 2330    Radiological Exams on Admission: CT ABDOMEN PELVIS W CONTRAST  Result Date: 01/14/2021 CLINICAL DATA:  Diffuse abdominal pain EXAM: CT ABDOMEN AND PELVIS WITH CONTRAST TECHNIQUE: Multidetector CT imaging of the abdomen and pelvis was performed using the standard protocol following bolus administration of intravenous contrast. CONTRAST:  OMNIPAQUE IOHEXOL 300 MG/ML  SOLN COMPARISON:  Ultrasound from earlier in the same day. FINDINGS: Lower chest: No acute abnormality. Hepatobiliary: Liver demonstrates geographic areas of decreased attenuation with interposed areas of increased attenuation throughout the liver. Some of the areas of increased attenuation follow the portal vein branches and may be related to periportal fibrosis. This appearance is similar to that seen on the prior ultrasound. There is some nodularity identified particularly in the left lobe of the liver along the falciform ligament. No  adequate comparison exam is available. No biliary ductal dilatation is seen. The common bile duct and gallbladder appear within normal limits. Pancreas: Pancreas demonstrates peripancreatic inflammatory change in the region of the head and uncinate process which extends along the anterior and inferior aspect of Gerota's fascia on the right consistent with acute pancreatitis. The body and tail appear within normal limits. Spleen: Normal in size without focal abnormality. Adrenals/Urinary Tract: Adrenal glands are within normal limits. Kidneys are well visualized and within normal limits. No renal calculi are seen. Bladder is well distended. Stomach/Bowel: Colon is predominately decompressed. No obstructive or inflammatory changes are seen. The appendix is within normal limits. Small bowel and stomach are unremarkable. Vascular/Lymphatic: No significant vascular findings are present. No enlarged abdominal or pelvic lymph nodes. No evidence of variceal change is seen. Reproductive:  Uterus and bilateral adnexa are unremarkable. Other: No abdominal Rollins hernia or abnormality. No abdominopelvic ascites. Musculoskeletal: No acute or significant osseous findings. IMPRESSION: Findings consistent with acute pancreatitis in the region of the head and uncinate process. Constellation of findings within the liver consistent with geographic areas of fatty infiltration and some more nodular areas which could be related to underlying cirrhotic change secondary to the patient's known history of cystic fibrosis. Nonemergent contrast enhanced MRI is recommended when the patient's clinical condition improves to allow for better breath hold and optimum imaging for better delineation of the overall disease process. No other focal abnormality is noted. Electronically Signed   By: Alcide Clever M.D.   On: 01/14/2021 23:58   US Abdomen Limited  Result Date: 01/14/2021 CLINICAL DATA:  Right upper quadrant pain EXAM: ULTRASOUND ABDOMEN LIMITED RIGHT UPPER QUADRANT COMPARISON:  None. FINDINGS: Gallbladder: Gallbladder is well distended. Negative sonographic Murphy's sign is noted. No [place]mer[place] thickening is seen. Common bile duct: Diameter: 5.5 mm. Liver: Diffusely heterogeneous without discrete mass. These changes may be related to hepatic inflammatory change. Acoustical window is not optimum. Portal vein is patent on color Doppler imaging with normal direction of blood flow towards the liver. Other: None. IMPRESSION: Diffuse heterogeneity in the liver without focal mass although the acoustical window with not optimal. CT with contrast is recommended for further evaluation. No focal abnormality in the gallbladder. Electronically Signed   By: Alcide Clever M.D.   On: 01/14/2021 19:28    EKG: Independently reviewed.  Assessment/Plan Principal Problem:   Acute alcoholic pancreatitis Active Problems:   ETOH abuse    Acute pancreatitis - No gallstones and nl LFTs today on scan. When EDP suggested EtOH as a  possible cause, pt admits to EtOH overuse and that this is likely the cause of her flares this past year. Pain control with dilaudid NPO for now IVF Strict intake and output Repeat labs in AM WBC 15.9k initially, 14k this AM... will hold off on Abx for now EtOH overuse - CIWA Pt states she needs to quit.  DVT prophylaxis: Lovenox Code Status: Full Family Communication: No family in room Disposition Plan: Home after pt taking POs again Consults called: None Admission status: Place in 20    Marisa Palmer M. DO [facility]  How to contact the [facility] Attending or Consulting provider 7A - 7P or covering provider during after hours 7P -7A, for this patient?  Check the care team in [facility] and look for a) attending/consulting [facility] provider listed and b) the [facility] team listed Log into [email or web address]  [employer] Physician Scheduling and messaging for groups and whole hospitals  On call and physician scheduling software  for group practices, residents, hospitalists and other medical providers for call, clinic, rotation and shift schedules. On[employer] is a hospital-wide system for scheduling doctors and paging doctors on call. EasyPlot is for scientific plotting and data analysis.  [email or web address]  and use [place]'s universal password to access. If you do not have the password, please contact the hospital operator.  Locate the [facility] provider you are looking for under [facility] and page to a number that you can be directly reached. If you still have difficulty reaching the provider, please page the [facility] (Director on Call) for the Hospitalists listed on amion for assistance.  01/15/2021, 5:18 AM

## 2021-01-16 DIAGNOSIS — K852 Alcohol induced acute pancreatitis without necrosis or infection: Principal | ICD-10-CM

## 2021-01-16 LAB — BASIC METABOLIC PANEL
Anion gap: 8 (ref 5–15)
BUN: 5 mg/dL — ABNORMAL LOW (ref 6–20)
CO2: 24 mmol/L (ref 22–32)
Calcium: 8 mg/dL — ABNORMAL LOW (ref 8.9–10.3)
Chloride: 104 mmol/L (ref 98–111)
Creatinine, Ser: 0.56 mg/dL (ref 0.44–1.00)
GFR, Estimated: >60 mL/min (ref >60)
Glucose, Bld: 72 mg/dL (ref 70–99)
Potassium: 3.6 mmol/L (ref 3.5–5.1)
Sodium: 136 mmol/L (ref 135–145)

## 2021-01-16 LAB — LIPASE, BLOOD: Lipase: 116 U/L — ABNORMAL HIGH (ref 11–51)

## 2021-01-16 NOTE — Plan of Care (Signed)
Pt stable with no needs.  

## 2021-01-16 NOTE — Discharge Instructions (Signed)
Advised to follow up PCP in one week. Advised to advance diet as tolerated. Advised to refrain from alcohol intake.

## 2021-01-16 NOTE — Discharge Summary (Signed)
Physician Discharge Summary  SHARONANN MALBROUGH QVZ:[id number] DOB: Dec 28, 1980 DOA: 01/14/2021  PCP: Patient, No Pcp Per (Inactive)  Admit date: 01/14/2021  Discharge date: 01/16/2021  Admitted From: Home.  Disposition:  Home.  Recommendations for Outpatient Follow-up:  Follow up with PCP in 1-2 weeks. Please obtain BMP/CBC in one week Advised to advance diet as tolerated. Advised to refrain from alcohol intake.  Home Health:None Equipment/Devices:None  Discharge Condition: Good CODE STATUS:Full code Diet recommendation:  Full Liquid diet  Brief Summary/ Hospital Course: This 40 years old female with PMH significant for opiate abuse from the age of 72 until last year (states she has been clean from opioids for last 2 years) but does admit drinking alcohol.  She presented to the ED with complaints of epigastric pain radiating towards the back for 2 days.  Lipase 325, CT abdomen consistent with acute pancreatitis.  Patient was admitted on the medical floor for acute pancreatitis, she was continued with bowel rest, n.p.o., IV fluids IV , pain medications. Lipase trended down, reports pain has improved, she was started on clear liquid diet, tolerated well. Continued on CIWA protocol for EtOH use.  Patient feels better and wants to be discharged.  Patient is being discharged home,  advised to advance diet as tolerated.  Advised to refrain from alcohol.  Discharge Diagnoses:  Principal Problem:   Acute alcoholic pancreatitis Active Problems:   ETOH abuse    Discharge Instructions  Discharge Instructions     Call MD for:  difficulty breathing, headache or visual disturbances   Complete by: As directed    Call MD for:  persistant dizziness or light-headedness   Complete by: As directed    Call MD for:  persistant nausea and vomiting   Complete by: As directed    Diet - low sodium heart healthy   Complete by: As directed    Diet Carb Modified   Complete by: As directed    Discharge  instructions   Complete by: As directed    Advised to follow up PCP in one week. Advised to advance diet as tolerated. Advised to refrain from alcohol intake.   Increase activity slowly   Complete by: As directed       Allergies as of 01/16/2021       Reactions   Compazine [prochlorperazine] Anxiety   Phenergan [promethazine Hcl] Anxiety        Medication List     TAKE these medications    acetaminophen 500 MG tablet Commonly known as: TYLENOL Take 500-1,000 mg by mouth every 6 (six) hours as needed for mild pain.        Follow-up Information     [facility] Follow up in 1 week(s).   Specialty: Family Medicine Contact information: [address] [place] [address] [phone]                Allergies  Allergen Reactions   Compazine [Prochlorperazine] Anxiety   Phenergan [Promethazine Hcl] Anxiety    Consultations:  None   Procedures/Studies: CT ABDOMEN PELVIS W CONTRAST  Result Date: 01/14/2021 CLINICAL DATA:  Diffuse abdominal pain EXAM: CT ABDOMEN AND PELVIS WITH CONTRAST TECHNIQUE: Multidetector CT imaging of the abdomen and pelvis was performed using the standard protocol following bolus administration of intravenous contrast. CONTRAST:  OMNIPAQUE IOHEXOL 300 MG/ML  SOLN COMPARISON:  Ultrasound from earlier in the same day. FINDINGS: Lower chest: No acute abnormality. Hepatobiliary: Liver demonstrates geographic areas of decreased attenuation  with interposed areas of increased attenuation throughout the liver. Some of the areas of increased attenuation follow the portal vein branches and may be related to periportal fibrosis. This appearance is similar to that seen on the prior ultrasound. There is some nodularity identified particularly in the left lobe of the liver along the falciform ligament. No adequate comparison exam is available. No biliary ductal dilatation is seen. The common bile duct and  gallbladder appear within normal limits. Pancreas: Pancreas demonstrates peripancreatic inflammatory change in the region of the head and uncinate process which extends along the anterior and inferior aspect of Gerota's fascia on the right consistent with acute pancreatitis. The body and tail appear within normal limits. Spleen: Normal in size without focal abnormality. Adrenals/Urinary Tract: Adrenal glands are within normal limits. Kidneys are well visualized and within normal limits. No renal calculi are seen. Bladder is well distended. Stomach/Bowel: Colon is predominately decompressed. No obstructive or inflammatory changes are seen. The appendix is within normal limits. Small bowel and stomach are unremarkable. Vascular/Lymphatic: No significant vascular findings are present. No enlarged abdominal or pelvic lymph nodes. No evidence of variceal change is seen. Reproductive: Uterus and bilateral adnexa are unremarkable. Other: No abdominal Kocurek hernia or abnormality. No abdominopelvic ascites. Musculoskeletal: No acute or significant osseous findings. IMPRESSION: Findings consistent with acute pancreatitis in the region of the head and uncinate process. Constellation of findings within the liver consistent with geographic areas of fatty infiltration and some more nodular areas which could be related to underlying cirrhotic change secondary to the patient's known history of cystic fibrosis. Nonemergent contrast enhanced MRI is recommended when the patient's clinical condition improves to allow for better breath hold and optimum imaging for better delineation of the overall disease process. No other focal abnormality is noted. Electronically Signed   By: Alcide Clever M.D.   On: 01/14/2021 23:58   US Abdomen Limited  Result Date: 01/14/2021 CLINICAL DATA:  Right upper quadrant pain EXAM: ULTRASOUND ABDOMEN LIMITED RIGHT UPPER QUADRANT COMPARISON:  None. FINDINGS: Gallbladder: Gallbladder is well distended.  Negative sonographic Murphy's sign is noted. No Collier thickening is seen. Common bile duct: Diameter: 5.5 mm. Liver: Diffusely heterogeneous without discrete mass. These changes may be related to hepatic inflammatory change. Acoustical window is not optimum. Portal vein is patent on color Doppler imaging with normal direction of blood flow towards the liver. Other: None. IMPRESSION: Diffuse heterogeneity in the liver without focal mass although the acoustical window with not optimal. CT with contrast is recommended for further evaluation. No focal abnormality in the gallbladder. Electronically Signed   By: Alcide Clever M.D.   On: 01/14/2021 19:28      Subjective: Patient was seen and examined at bedside.  Overnight events noted.   She reports abdominal pain has resolved,  She has tolerated clear liquid diet and wants to be discharged.  Discharge Exam: Vitals:   01/16/21 0600 01/16/21 1148  BP: 107/67 110/70  Pulse: (!) 101 99  Resp: 18   Temp: 97.9 F (36.6 C)   SpO2: 96%    Vitals:   01/16/21 0122 01/16/21 0128 01/16/21 0600 01/16/21 1148  BP: 127/80 127/80 107/67 110/70  Pulse: (!) 102 (!) 102 (!) 101 99  Resp:  20 18   Temp:  98.1 F (36.7 C) 97.9 F (36.6 C)   TempSrc:      SpO2:  99% 96%   Weight:      Height:        General: Pt is  alert, awake, not in acute distress Cardiovascular: RRR, S1/S2 +, no rubs, no gallops Respiratory: CTA bilaterally, no wheezing, no rhonchi Abdominal: Soft, NT, ND, bowel sounds + Extremities: no edema, no cyanosis    The results of significant diagnostics from this hospitalization (including imaging, microbiology, ancillary and laboratory) are listed below for reference.     Microbiology: Recent Results (from the past 240 hour(s))  Resp Panel by RT-PCR (Flu A&B, Covid) Nasopharyngeal Swab     Status: None   Collection Time: 01/15/21  2:44 AM   Specimen: Nasopharyngeal Swab; Nasopharyngeal(NP) swabs in vial transport medium  Result Value  Ref Range Status   SARS Coronavirus 2 by RT PCR NEGATIVE NEGATIVE Final    Comment: (NOTE) SARS-CoV-2 target nucleic acids are NOT DETECTED.  The SARS-CoV-2 RNA is generally detectable in upper respiratory specimens during the acute phase of infection. The lowest concentration of SARS-CoV-2 viral copies this assay can detect is 138 copies/mL. A negative result does not preclude SARS-Cov-2 infection and should not be used as the sole basis for treatment or other patient management decisions. A negative result may occur with  improper specimen collection/handling, submission of specimen other than nasopharyngeal swab, presence of viral mutation(s) within the areas targeted by this assay, and inadequate number of viral copies(<138 copies/mL). A negative result must be combined with clinical observations, patient history, and epidemiological information. The expected result is Negative.  Fact Sheet for Patients:  BloggerCourse.com  Fact Sheet for Healthcare Providers:  SeriousBroker.it  This test is no t yet approved or cleared by the [place] FDA and  has been authorized for detection and/or diagnosis of SARS-CoV-2 by FDA under an Emergency Use Authorization (EUA). This EUA will remain  in effect (meaning this test can be used) for the duration of the COVID-19 declaration under Section 564(b)(1) of the Act, 21 U.S.C.section 360bbb-3(b)(1), unless the authorization is terminated  or revoked sooner.       Influenza A by PCR NEGATIVE NEGATIVE Final   Influenza B by PCR NEGATIVE NEGATIVE Final    Comment: (NOTE) The Xpert Xpress SARS-CoV-2/FLU/RSV plus assay is intended as an aid in the diagnosis of influenza from Nasopharyngeal swab specimens and should not be used as a sole basis for treatment. Nasal washings and aspirates are unacceptable for Xpert Xpress SARS-CoV-2/FLU/RSV testing.  Fact Sheet for  Patients: BloggerCourse.com  Fact Sheet for Healthcare Providers: SeriousBroker.it  This test is not yet approved or cleared by the [place] FDA and has been authorized for detection and/or diagnosis of SARS-CoV-2 by FDA under an Emergency Use Authorization (EUA). This EUA will remain in effect (meaning this test can be used) for the duration of the COVID-19 declaration under Section 564(b)(1) of the Act, 21 U.S.C. section 360bbb-3(b)(1), unless the authorization is terminated or revoked.  Performed at [facility], [address], [place], [place] [address]      Labs: BNP (last 3 results) No results for input(s): BNP in the last 8760 hours. Basic Metabolic Panel: Recent Labs  Lab 01/14/21 2206 01/15/21 0400 01/15/21 0541 01/16/21 0347  NA 135 138  --  136  K 3.6 3.7  --  3.6  CL 100 102  --  104  CO2 24 27  --  24  GLUCOSE 108* 105*  --  72  BUN 5* <5*  --  5*  CREATININE 0.59 0.47  --  0.56  CALCIUM 9.4 9.0  --  8.0*  MG  --   --  2.0  --   PHOS  --   --  4.7*  --    Liver Function Tests: Recent Labs  Lab 01/14/21 2206 01/15/21 0400  AST 67* 52*  ALT 51* 42  ALKPHOS 267* 230*  BILITOT 0.8 0.9  PROT 8.3* 7.6  ALBUMIN 3.8 3.5   Recent Labs  Lab 01/14/21 2206 01/16/21 0347  LIPASE 328* 116*   No results for input(s): AMMONIA in the last 168 hours. CBC: Recent Labs  Lab 01/14/21 2206 01/15/21 0400  WBC 15.9* 14.9*  HGB 14.7 13.7  HCT 43.1 41.3  MCV 102.4* 104.6*  PLT 466* 430*   Cardiac Enzymes: No results for input(s): CKTOTAL, CKMB, CKMBINDEX, TROPONINI in the last 168 hours. BNP: Invalid input(s): POCBNP CBG: No results for input(s): GLUCAP in the last 168 hours. D-Dimer No results for input(s): DDIMER in the last 72 hours. Hgb A1c No results for input(s): HGBA1C in the last 72 hours. Lipid Profile No results for input(s): CHOL, HDL, LDLCALC, TRIG, CHOLHDL,  LDLDIRECT in the last 72 hours. Thyroid function studies No results for input(s): TSH, T4TOTAL, T3FREE, THYROIDAB in the last 72 hours.  Invalid input(s): FREET3 Anemia work up No results for input(s): VITAMINB12, FOLATE, FERRITIN, TIBC, IRON, RETICCTPCT in the last 72 hours. Urinalysis    Component Value Date/Time   COLORURINE YELLOW 01/14/2021 2330   APPEARANCEUR CLEAR 01/14/2021 2330   LABSPEC 1.018 01/14/2021 2330   PHURINE 5.0 01/14/2021 2330   GLUCOSEU NEGATIVE 01/14/2021 2330   HGBUR NEGATIVE 01/14/2021 2330   BILIRUBINUR NEGATIVE 01/14/2021 2330   KETONESUR NEGATIVE 01/14/2021 2330   PROTEINUR NEGATIVE 01/14/2021 2330   UROBILINOGEN 1.0 02/08/2013 0422   NITRITE POSITIVE (A) 01/14/2021 2330   LEUKOCYTESUR NEGATIVE 01/14/2021 2330   Sepsis Labs Invalid input(s): PROCALCITONIN,  WBC,  LACTICIDVEN Microbiology Recent Results (from the past 240 hour(s))  Resp Panel by RT-PCR (Flu A&B, Covid) Nasopharyngeal Swab     Status: None   Collection Time: 01/15/21  2:44 AM   Specimen: Nasopharyngeal Swab; Nasopharyngeal(NP) swabs in vial transport medium  Result Value Ref Range Status   SARS Coronavirus 2 by RT PCR NEGATIVE NEGATIVE Final    Comment: (NOTE) SARS-CoV-2 target nucleic acids are NOT DETECTED.  The SARS-CoV-2 RNA is generally detectable in upper respiratory specimens during the acute phase of infection. The lowest concentration of SARS-CoV-2 viral copies this assay can detect is 138 copies/mL. A negative result does not preclude SARS-Cov-2 infection and should not be used as the sole basis for treatment or other patient management decisions. A negative result may occur with  improper specimen collection/handling, submission of specimen other than nasopharyngeal swab, presence of viral mutation(s) within the areas targeted by this assay, and inadequate number of viral copies(<138 copies/mL). A negative result must be combined with clinical observations, patient  history, and epidemiological information. The expected result is Negative.  Fact Sheet for Patients:  BloggerCourse.com  Fact Sheet for Healthcare Providers:  SeriousBroker.it  This test is no t yet approved or cleared by the [place] FDA and  has been authorized for detection and/or diagnosis of SARS-CoV-2 by FDA under an Emergency Use Authorization (EUA). This EUA will remain  in effect (meaning this test can be used) for the duration of the COVID-19 declaration under Section 564(b)(1) of the Act, 21 U.S.C.section 360bbb-3(b)(1), unless the authorization is terminated  or revoked sooner.       Influenza A by PCR NEGATIVE NEGATIVE Final   Influenza B by PCR NEGATIVE NEGATIVE  Final    Comment: (NOTE) The Xpert Xpress SARS-CoV-2/FLU/RSV plus assay is intended as an aid in the diagnosis of influenza from Nasopharyngeal swab specimens and should not be used as a sole basis for treatment. Nasal washings and aspirates are unacceptable for Xpert Xpress SARS-CoV-2/FLU/RSV testing.  Fact Sheet for Patients: BloggerCourse.comhttps://www.fda.gov/media/152166/download  Fact Sheet for Healthcare Providers: SeriousBroker.ithttps://www.fda.gov/media/152162/download  This test is not yet approved or cleared by the [place] FDA and has been authorized for detection and/or diagnosis of SARS-CoV-2 by FDA under an Emergency Use Authorization (EUA). This EUA will remain in effect (meaning this test can be used) for the duration of the COVID-19 declaration under Section 564(b)(1) of the Act, 21 U.S.C. section 360bbb-3(b)(1), unless the authorization is terminated or revoked.  Performed at [facility], [address] [place], [place] [id number]      Time coordinating discharge: Over 30 minutes  SIGNED:   Cipriano BunkerPARDEEP Ilya Ess, MD  [facility] 01/16/2021, 1:30 PM   If 7PM-7AM, please contact night-coverage [email or web address]

## 2021-01-16 NOTE — Progress Notes (Signed)
Pt stable at this time. No needs at time of d/c instructions and education.

## 2021-02-24 ENCOUNTER — Encounter (HOSPITAL_COMMUNITY): Payer: Self-pay

## 2021-02-24 ENCOUNTER — Inpatient Hospital Stay (HOSPITAL_COMMUNITY)
Admission: EM | Admit: 2021-02-24 | Discharge: 2021-02-27 | DRG: 438 | Disposition: A | Discharge status: Home / Self Care | Payer: Medicaid Other | Attending: Student | Admitting: Student

## 2021-02-24 ENCOUNTER — Other Ambulatory Visit: Payer: Self-pay

## 2021-02-24 DIAGNOSIS — F32A Depression, unspecified: Secondary | ICD-10-CM | POA: Diagnosis present

## 2021-02-24 DIAGNOSIS — F1010 Alcohol abuse, uncomplicated: Secondary | ICD-10-CM | POA: Diagnosis present

## 2021-02-24 DIAGNOSIS — F411 Generalized anxiety disorder: Secondary | ICD-10-CM | POA: Diagnosis present

## 2021-02-24 DIAGNOSIS — R7989 Other specified abnormal findings of blood chemistry: Secondary | ICD-10-CM | POA: Diagnosis present

## 2021-02-24 DIAGNOSIS — F17210 Nicotine dependence, cigarettes, uncomplicated: Secondary | ICD-10-CM | POA: Diagnosis present

## 2021-02-24 DIAGNOSIS — E0781 Sick-euthyroid syndrome: Secondary | ICD-10-CM | POA: Diagnosis present

## 2021-02-24 DIAGNOSIS — Z28310 Unvaccinated for covid-19: Secondary | ICD-10-CM

## 2021-02-24 DIAGNOSIS — K859 Acute pancreatitis without necrosis or infection, unspecified: Secondary | ICD-10-CM | POA: Diagnosis present

## 2021-02-24 DIAGNOSIS — G8929 Other chronic pain: Secondary | ICD-10-CM | POA: Diagnosis present

## 2021-02-24 DIAGNOSIS — Z8249 Family history of ischemic heart disease and other diseases of the circulatory system: Secondary | ICD-10-CM

## 2021-02-24 DIAGNOSIS — F419 Anxiety disorder, unspecified: Secondary | ICD-10-CM | POA: Diagnosis present

## 2021-02-24 DIAGNOSIS — U071 COVID-19: Secondary | ICD-10-CM | POA: Diagnosis present

## 2021-02-24 DIAGNOSIS — K852 Alcohol induced acute pancreatitis without necrosis or infection: Principal | ICD-10-CM | POA: Diagnosis present

## 2021-02-24 DIAGNOSIS — Z888 Allergy status to other drugs, medicaments and biological substances status: Secondary | ICD-10-CM

## 2021-02-24 DIAGNOSIS — F1020 Alcohol dependence, uncomplicated: Secondary | ICD-10-CM | POA: Diagnosis present

## 2021-02-24 DIAGNOSIS — F439 Reaction to severe stress, unspecified: Secondary | ICD-10-CM | POA: Diagnosis present

## 2021-02-24 DIAGNOSIS — Z833 Family history of diabetes mellitus: Secondary | ICD-10-CM

## 2021-02-24 NOTE — ED Triage Notes (Signed)
Pt BIB EMS  Pt complains of pancreatitis flare up that started 2 days ago. Pt complains of increased pain today.   160/80 bp  120 hr  100% room air

## 2021-02-25 ENCOUNTER — Encounter (HOSPITAL_COMMUNITY): Payer: Self-pay | Admitting: Internal Medicine

## 2021-02-25 ENCOUNTER — Inpatient Hospital Stay (HOSPITAL_COMMUNITY): Payer: Medicaid Other

## 2021-02-25 DIAGNOSIS — Z8249 Family history of ischemic heart disease and other diseases of the circulatory system: Secondary | ICD-10-CM | POA: Diagnosis not present

## 2021-02-25 DIAGNOSIS — Z833 Family history of diabetes mellitus: Secondary | ICD-10-CM | POA: Diagnosis not present

## 2021-02-25 DIAGNOSIS — F419 Anxiety disorder, unspecified: Secondary | ICD-10-CM

## 2021-02-25 DIAGNOSIS — K859 Acute pancreatitis without necrosis or infection, unspecified: Secondary | ICD-10-CM | POA: Diagnosis not present

## 2021-02-25 DIAGNOSIS — R748 Abnormal levels of other serum enzymes: Secondary | ICD-10-CM

## 2021-02-25 DIAGNOSIS — F17210 Nicotine dependence, cigarettes, uncomplicated: Secondary | ICD-10-CM | POA: Diagnosis present

## 2021-02-25 DIAGNOSIS — Z888 Allergy status to other drugs, medicaments and biological substances status: Secondary | ICD-10-CM | POA: Diagnosis not present

## 2021-02-25 DIAGNOSIS — Z28310 Unvaccinated for covid-19: Secondary | ICD-10-CM | POA: Diagnosis not present

## 2021-02-25 DIAGNOSIS — E0781 Sick-euthyroid syndrome: Secondary | ICD-10-CM | POA: Diagnosis present

## 2021-02-25 DIAGNOSIS — U071 COVID-19: Secondary | ICD-10-CM

## 2021-02-25 DIAGNOSIS — F32A Depression, unspecified: Secondary | ICD-10-CM | POA: Diagnosis present

## 2021-02-25 DIAGNOSIS — K7030 Alcoholic cirrhosis of liver without ascites: Secondary | ICD-10-CM

## 2021-02-25 DIAGNOSIS — R000 Tachycardia, unspecified: Secondary | ICD-10-CM

## 2021-02-25 DIAGNOSIS — R1013 Epigastric pain: Secondary | ICD-10-CM | POA: Diagnosis present

## 2021-02-25 DIAGNOSIS — F1010 Alcohol abuse, uncomplicated: Secondary | ICD-10-CM | POA: Diagnosis not present

## 2021-02-25 DIAGNOSIS — D72825 Bandemia: Secondary | ICD-10-CM

## 2021-02-25 DIAGNOSIS — G8929 Other chronic pain: Secondary | ICD-10-CM | POA: Diagnosis present

## 2021-02-25 DIAGNOSIS — K852 Alcohol induced acute pancreatitis without necrosis or infection: Secondary | ICD-10-CM | POA: Diagnosis present

## 2021-02-25 DIAGNOSIS — F1020 Alcohol dependence, uncomplicated: Secondary | ICD-10-CM | POA: Diagnosis present

## 2021-02-25 DIAGNOSIS — R7989 Other specified abnormal findings of blood chemistry: Secondary | ICD-10-CM | POA: Diagnosis not present

## 2021-02-25 LAB — URINALYSIS, ROUTINE W REFLEX MICROSCOPIC
Bilirubin Urine: NEGATIVE
Glucose, UA: NEGATIVE mg/dL
Hgb urine dipstick: NEGATIVE
Ketones, ur: 20 mg/dL — AB
Leukocytes,Ua: NEGATIVE
Nitrite: POSITIVE — AB
Protein, ur: NEGATIVE mg/dL
Specific Gravity, Urine: 1.009 (ref 1.005–1.030)
pH: 6 (ref 5.0–8.0)

## 2021-02-25 LAB — PREGNANCY, URINE: Preg Test, Ur: NEGATIVE

## 2021-02-25 LAB — CBC WITH DIFFERENTIAL/PLATELET
Abs Immature Granulocytes: 0.05 10*3/uL (ref 0.00–0.07)
Abs Immature Granulocytes: 0.03 10*3/uL (ref 0.00–0.07)
Basophils Absolute: 0 10*3/uL (ref 0.0–0.1)
Basophils Absolute: 0 10*3/uL (ref 0.0–0.1)
Basophils Relative: 0 %
Basophils Relative: 0 %
Eosinophils Absolute: 0.1 10*3/uL (ref 0.0–0.5)
Eosinophils Absolute: 0.2 10*3/uL (ref 0.0–0.5)
Eosinophils Relative: 1 %
Eosinophils Relative: 1 %
HCT: 44.8 % (ref 36.0–46.0)
HCT: 42.5 % (ref 36.0–46.0)
Hemoglobin: 15.2 g/dL — ABNORMAL HIGH (ref 12.0–15.0)
Hemoglobin: 14.2 g/dL (ref 12.0–15.0)
Immature Granulocytes: 0 %
Immature Granulocytes: 0 %
Lymphocytes Relative: 19 %
Lymphocytes Relative: 22 %
Lymphs Abs: 2.4 10*3/uL (ref 0.7–4.0)
Lymphs Abs: 2.7 10*3/uL (ref 0.7–4.0)
MCH: 34.1 pg — ABNORMAL HIGH (ref 26.0–34.0)
MCH: 33.6 pg (ref 26.0–34.0)
MCHC: 33.9 g/dL (ref 30.0–36.0)
MCHC: 33.4 g/dL (ref 30.0–36.0)
MCV: 100.4 fL — ABNORMAL HIGH (ref 80.0–100.0)
MCV: 100.5 fL — ABNORMAL HIGH (ref 80.0–100.0)
Monocytes Absolute: 1 10*3/uL (ref 0.1–1.0)
Monocytes Absolute: 1 10*3/uL (ref 0.1–1.0)
Monocytes Relative: 8 %
Monocytes Relative: 8 %
Neutro Abs: 8.7 10*3/uL — ABNORMAL HIGH (ref 1.7–7.7)
Neutro Abs: 8.1 10*3/uL — ABNORMAL HIGH (ref 1.7–7.7)
Neutrophils Relative %: 72 %
Neutrophils Relative %: 69 %
Platelets: 421 10*3/uL — ABNORMAL HIGH (ref 150–400)
Platelets: 399 10*3/uL (ref 150–400)
RBC: 4.46 MIL/uL (ref 3.87–5.11)
RBC: 4.23 MIL/uL (ref 3.87–5.11)
RDW: 12.7 % (ref 11.5–15.5)
RDW: 12.6 % (ref 11.5–15.5)
WBC: 12.4 10*3/uL — ABNORMAL HIGH (ref 4.0–10.5)
WBC: 11.9 10*3/uL — ABNORMAL HIGH (ref 4.0–10.5)
nRBC: 0 % (ref 0.0–0.2)
nRBC: 0 % (ref 0.0–0.2)

## 2021-02-25 LAB — COMPREHENSIVE METABOLIC PANEL
ALT: 32 U/L (ref 0–44)
AST: 58 U/L — ABNORMAL HIGH (ref 15–41)
Albumin: 4 g/dL (ref 3.5–5.0)
Alkaline Phosphatase: 237 U/L — ABNORMAL HIGH (ref 38–126)
Anion gap: 12 (ref 5–15)
BUN: 6 mg/dL (ref 6–20)
CO2: 25 mmol/L (ref 22–32)
Calcium: 9.3 mg/dL (ref 8.9–10.3)
Chloride: 100 mmol/L (ref 98–111)
Creatinine, Ser: 0.49 mg/dL (ref 0.44–1.00)
GFR, Estimated: >60 mL/min (ref >60)
Glucose, Bld: 98 mg/dL (ref 70–99)
Potassium: 3.5 mmol/L (ref 3.5–5.1)
Sodium: 137 mmol/L (ref 135–145)
Total Bilirubin: 1.2 mg/dL (ref 0.3–1.2)
Total Protein: 8.5 g/dL — ABNORMAL HIGH (ref 6.5–8.1)

## 2021-02-25 LAB — RESP PANEL BY RT-PCR (FLU A&B, COVID) ARPGX2
Influenza A by PCR: NEGATIVE
Influenza B by PCR: NEGATIVE
SARS Coronavirus 2 by RT PCR: POSITIVE — AB

## 2021-02-25 LAB — GLUCOSE, CAPILLARY
Glucose-Capillary: 90 mg/dL (ref 70–99)
Glucose-Capillary: 87 mg/dL (ref 70–99)

## 2021-02-25 LAB — CREATININE, SERUM
Creatinine, Ser: 0.32 mg/dL — ABNORMAL LOW (ref 0.44–1.00)
GFR, Estimated: >60 mL/min (ref >60)

## 2021-02-25 LAB — LIPASE, BLOOD: Lipase: 787 U/L — ABNORMAL HIGH (ref 11–51)

## 2021-02-25 LAB — CBG MONITORING, ED: Glucose-Capillary: 101 mg/dL — ABNORMAL HIGH (ref 70–99)

## 2021-02-25 MED ORDER — HYDROMORPHONE HCL 1 MG/ML IJ SOLN
1.0000 mg | Freq: Once | INTRAMUSCULAR | Status: AC
  Administered 2021-02-25: 1 mg via INTRAVENOUS
  Filled 2021-02-25: qty 1

## 2021-02-25 MED ORDER — SODIUM CHLORIDE 0.9 % IV SOLN: 200.0000 mg | Freq: Once | INTRAVENOUS | Status: DC

## 2021-02-25 MED ORDER — MORPHINE SULFATE (PF) 4 MG/ML IV SOLN
4.0000 mg | Freq: Once | INTRAVENOUS | Status: AC
  Administered 2021-02-25: 4 mg via INTRAVENOUS
  Filled 2021-02-25: qty 1

## 2021-02-25 MED ORDER — SODIUM CHLORIDE 0.9 % IV SOLN
100.0000 mg | Freq: Every day | INTRAVENOUS | Status: DC
  Administered 2021-02-26: 100 mg via INTRAVENOUS
  Filled 2021-02-25: qty 20

## 2021-02-25 MED ORDER — SODIUM CHLORIDE 0.9 % IV SOLN: 100.0000 mg | Freq: Every day | INTRAVENOUS | Status: DC

## 2021-02-25 MED ORDER — THIAMINE HCL 100 MG/ML IJ SOLN
100.0000 mg | Freq: Every day | INTRAMUSCULAR | Status: DC
  Filled 2021-02-25: qty 2

## 2021-02-25 MED ORDER — LORAZEPAM 1 MG PO TABS
1.0000 mg | ORAL_TABLET | ORAL | Status: DC | PRN
  Administered 2021-02-25 – 2021-02-27 (×5): 1 mg via ORAL
  Filled 2021-02-25 (×5): qty 1

## 2021-02-25 MED ORDER — HYDROMORPHONE HCL 1 MG/ML IJ SOLN
1.0000 mg | INTRAMUSCULAR | Status: DC | PRN
  Administered 2021-02-25 – 2021-02-26 (×11): 1 mg via INTRAVENOUS
  Filled 2021-02-25 (×11): qty 1

## 2021-02-25 MED ORDER — LORAZEPAM 2 MG/ML IJ SOLN
0.5000 mg | Freq: Once | INTRAMUSCULAR | Status: AC
  Administered 2021-02-25: 0.5 mg via INTRAVENOUS
  Filled 2021-02-25: qty 1

## 2021-02-25 MED ORDER — LORAZEPAM 2 MG/ML IJ SOLN
1.0000 mg | INTRAMUSCULAR | Status: DC | PRN
  Administered 2021-02-25 – 2021-02-26 (×2): 1 mg via INTRAVENOUS
  Filled 2021-02-25 (×2): qty 1

## 2021-02-25 MED ORDER — IOHEXOL 350 MG/ML SOLN
100.0000 mL | Freq: Once | INTRAVENOUS | Status: AC | PRN
  Administered 2021-02-25: 80 mL via INTRAVENOUS

## 2021-02-25 MED ORDER — LACTATED RINGERS IV BOLUS
1000.0000 mL | Freq: Once | INTRAVENOUS | Status: AC
  Administered 2021-02-25: 1000 mL via INTRAVENOUS

## 2021-02-25 MED ORDER — THIAMINE HCL 100 MG PO TABS
100.0000 mg | ORAL_TABLET | Freq: Every day | ORAL | Status: DC
  Administered 2021-02-25 – 2021-02-26 (×2): 100 mg via ORAL
  Filled 2021-02-25 (×2): qty 1

## 2021-02-25 MED ORDER — ONDANSETRON HCL 4 MG/2ML IJ SOLN
4.0000 mg | Freq: Once | INTRAMUSCULAR | Status: AC
  Administered 2021-02-25: 4 mg via INTRAVENOUS
  Filled 2021-02-25: qty 2

## 2021-02-25 MED ORDER — SODIUM CHLORIDE 0.9 % IV SOLN
100.0000 mg | INTRAVENOUS | Status: AC
  Administered 2021-02-25 (×2): 100 mg via INTRAVENOUS
  Filled 2021-02-25 (×2): qty 20

## 2021-02-25 MED ORDER — SODIUM CHLORIDE 0.9 % IV SOLN: INTRAVENOUS | Status: DC

## 2021-02-25 MED ORDER — ONDANSETRON HCL 4 MG/2ML IJ SOLN
4.0000 mg | Freq: Four times a day (QID) | INTRAMUSCULAR | Status: DC | PRN
  Administered 2021-02-25 – 2021-02-26 (×4): 4 mg via INTRAVENOUS
  Filled 2021-02-25 (×4): qty 2

## 2021-02-25 MED ORDER — HEPARIN SODIUM (PORCINE) 5000 UNIT/ML IJ SOLN
5000.0000 [IU] | Freq: Three times a day (TID) | INTRAMUSCULAR | Status: DC
  Administered 2021-02-25 – 2021-02-27 (×7): 5000 [IU] via SUBCUTANEOUS
  Filled 2021-02-25 (×8): qty 1

## 2021-02-25 MED ORDER — FOLIC ACID 1 MG PO TABS
1.0000 mg | ORAL_TABLET | Freq: Every day | ORAL | Status: DC
  Administered 2021-02-25 – 2021-02-26 (×2): 1 mg via ORAL
  Filled 2021-02-25 (×2): qty 1

## 2021-02-25 MED ORDER — THIAMINE HCL 100 MG/ML IJ SOLN: 100.0000 mg | Freq: Every day | INTRAMUSCULAR | Status: DC

## 2021-02-25 MED ORDER — ADULT MULTIVITAMIN W/MINERALS CH
1.0000 | ORAL_TABLET | Freq: Every day | ORAL | Status: DC
  Administered 2021-02-25 – 2021-02-26 (×2): 1 via ORAL
  Filled 2021-02-25 (×2): qty 1

## 2021-02-25 MED ORDER — LACTATED RINGERS IV SOLN: INTRAVENOUS | Status: AC

## 2021-02-25 NOTE — ED Notes (Signed)
Pt continues to leave exam room. Explained pt has room upstairs and will going up shortly. Pt aware she needs to remain in isolation due to Covid.

## 2021-02-25 NOTE — ED Notes (Signed)
Hispitalist at the bedside.

## 2021-02-25 NOTE — ED Notes (Signed)
Hospitalist notified regarding pt's tachycardic heart rate, emesis, and request for anxiety medication. No additional orders at this time. Will continue to monitor.

## 2021-02-25 NOTE — ED Notes (Signed)
Pt accidentally removed LH IV when sleeping. RN aware. RAC IV intact.

## 2021-02-25 NOTE — H&P (Signed)
History and Physical    Marisa Palmer ZRA:[id number] DOB: 07/10/1981 DOA: 02/24/2021  PCP: Patient, No Pcp Per (Inactive)  Patient coming from: Home.  Chief Complaint: Abdominal pain.  HPI: Marisa Palmer is a 40 y.o. female with history of alcoholic pancreatitis admitted in June 2022 2 months ago presents to the ER because of worsening pain over the last 24 hours.  Pain is mostly in the epigastric area radiating to the back.  Some nausea.  Patient states since last admission she has largely stopped drinking alcohol but 2 nights ago she had gone to dinner with her parents when she did drink some wine.  ED Course: In the ER patient labs show lipase of 787 alkaline phos was 237 AST of 58 bilirubin 1.2 WBC of 12.4 COVID test came back positive.  Patient was started on fluids for acute pancreatitis.  Chest x-ray and pulmonary markers are pending.  Patient is not hypoxic or febrile.  Review of Systems: As per HPI, rest all negative.   Past Medical History:  Diagnosis Date   Back pain, chronic    Coccygeal fracture (HCC)    Cystic fibrosis    DDD (degenerative disc disease)    Ovarian cyst    Substance abuse (HCC)    Thoracic compression fracture (HCC)     Past Surgical History:  Procedure Laterality Date   LAPAROSCOPY     OVARIAN CYST REMOVAL     TUBAL LIGATION       reports that she has been smoking cigarettes. She has a 10.00 pack-year smoking history. She has never used smokeless tobacco. She reports current alcohol use. She reports previous drug use. Drugs: Morphine, Oxycodone, Hydrocodone, and Marijuana.  Allergies  Allergen Reactions   Compazine [Prochlorperazine] Anxiety   Phenergan [Promethazine Hcl] Anxiety    Family History  Problem Relation Age of Onset   Heart failure Mother    Diabetes Mother    Hypertension Father    Heart failure Other    Diabetes Other    Cancer Other     Prior to Admission medications   Medication Sig Start Date End Date Taking?  Authorizing Provider  acetaminophen (TYLENOL) 500 MG tablet Take 500-1,000 mg by mouth every 6 (six) hours as needed for mild pain.    [provider]    Physical Exam: Constitutional: Moderately built and nourished. Vitals:   02/25/21 0230 02/25/21 0300 02/25/21 0345 02/25/21 0400  BP: (!) 143/89 134/88 131/89 (!) 141/101  Pulse: 97 99 96 88  Resp: 18 18 15  (!) 21  Temp:      TempSrc:      SpO2: 100% 98% 100% 93%   Eyes: Anicteric no pallor. ENMT: No discharge from the ears eyes nose and mouth: Neck: No mass felt.  No neck rigidity. Respiratory: No rhonchi or crepitations. Cardiovascular: S1-S2 heard. Abdomen: Soft nontender bowel sound present. Musculoskeletal: No edema. Skin: No rash. Neurologic: Alert awake oriented to time place and person.  Moves all extremities. Psychiatric: Appears normal.  Normal affect.   Labs on Admission: I have personally reviewed following labs and imaging studies  CBC: Recent Labs  Lab 02/25/21 0123  WBC 12.4*  NEUTROABS 8.7*  HGB 15.2*  HCT 44.8  MCV 100.4*  PLT 421*   Basic Metabolic Panel: Recent Labs  Lab 02/25/21 0123  NA 137  K 3.5  CL 100  CO2 25  GLUCOSE 98  BUN 6  CREATININE 0.49  CALCIUM 9.3   GFR: CrCl cannot  be calculated (Unknown ideal weight.). Liver Function Tests: Recent Labs  Lab 02/25/21 0123  AST 58*  ALT 32  ALKPHOS 237*  BILITOT 1.2  PROT 8.5*  ALBUMIN 4.0   Recent Labs  Lab 02/25/21 0123  LIPASE 787*   No results for input(s): AMMONIA in the last 168 hours. Coagulation Profile: No results for input(s): INR, PROTIME in the last 168 hours. Cardiac Enzymes: No results for input(s): CKTOTAL, CKMB, CKMBINDEX, TROPONINI in the last 168 hours. BNP (last 3 results) No results for input(s): PROBNP in the last 8760 hours. HbA1C: No results for input(s): HGBA1C in the last 72 hours. CBG: No results for input(s): GLUCAP in the last 168 hours. Lipid Profile: No results for input(s):  CHOL, HDL, LDLCALC, TRIG, CHOLHDL, LDLDIRECT in the last 72 hours. Thyroid Function Tests: No results for input(s): TSH, T4TOTAL, FREET4, T3FREE, THYROIDAB in the last 72 hours. Anemia Panel: No results for input(s): VITAMINB12, FOLATE, FERRITIN, TIBC, IRON, RETICCTPCT in the last 72 hours. Urine analysis:    Component Value Date/Time   COLORURINE YELLOW 02/25/2021 0123   APPEARANCEUR CLEAR 02/25/2021 0123   LABSPEC 1.009 02/25/2021 0123   PHURINE 6.0 02/25/2021 0123   GLUCOSEU NEGATIVE 02/25/2021 0123   HGBUR NEGATIVE 02/25/2021 0123   BILIRUBINUR NEGATIVE 02/25/2021 0123   KETONESUR 20 (A) 02/25/2021 0123   PROTEINUR NEGATIVE 02/25/2021 0123   UROBILINOGEN 1.0 02/08/2013 0422   NITRITE POSITIVE (A) 02/25/2021 0123   LEUKOCYTESUR NEGATIVE 02/25/2021 0123   Sepsis Labs: @LABRCNTIP (procalcitonin:4,lacticidven:4) ) Recent Results (from the past 240 hour(s))  Resp Panel by RT-PCR (Flu A&B, Covid) Nasopharyngeal Swab     Status: Abnormal   Collection Time: 02/25/21  3:08 AM   Specimen: Nasopharyngeal Swab; Nasopharyngeal(NP) swabs in vial transport medium  Result Value Ref Range Status   SARS Coronavirus 2 by RT PCR POSITIVE (A) NEGATIVE Final    Comment: RESULT CALLED TO, READ BACK BY AND VERIFIED WITH: KERSCHERI,Q RN @0425  ON 02/25/21 JAKSON,K (NOTE) SARS-CoV-2 target nucleic acids are DETECTED.  The SARS-CoV-2 RNA is generally detectable in upper respiratory specimens during the acute phase of infection. Positive results are indicative of the presence of the identified virus, but do not rule out bacterial infection or co-infection with other pathogens not detected by the test. Clinical correlation with patient history and other diagnostic information is necessary to determine patient infection status. The expected result is Negative.  Fact Sheet for Patients:  Fact Sheet for Healthcare  Providers: 04/27/21  This test is not yet approved or cleared by the [employer] FDA and  has been authorized for detection and/or diagnosis of SARS-CoV-2 by FDA under an Emergency Use Authorization (EUA).  This EUA will remain in effect (meaning this test  can be used) for the duration of  the COVID-19 declaration under Section 564(b)(1) of the Act, 21 U.S.C. section 360bbb-3(b)(1), unless the authorization is terminated or revoked sooner.     Influenza A by PCR NEGATIVE NEGATIVE Final   Influenza B by PCR NEGATIVE NEGATIVE Final    Comment: (NOTE) The Xpert Xpress SARS-CoV-2/FLU/RSV plus assay is intended as an aid in the diagnosis of influenza from Nasopharyngeal swab specimens and should not be used as a sole basis for treatment. Nasal washings and aspirates are unacceptable for Xpert Xpress SARS-CoV-2/FLU/RSV testing.  Fact Sheet for Patients: [employer]ker[employer]it  Fact Sheet for Healthcare Providers: [place]  This test is not yet approved or cleared by the [employer] FDA and has been authorized for detection and/or diagnosis  of SARS-CoV-2 by FDA under an Emergency Use Authorization (EUA). This EUA will remain in effect (meaning this test can be used) for the duration of the COVID-19 declaration under Section 564(b)(1) of the Act, 21 U.S.C. section 360bbb-3(b)(1), unless the authorization is terminated or revoked.  Performed at [facility], [address] [place], [place] [address]      Radiological Exams on Admission: No results found.    Assessment/Plan Principal Problem:   Acute pancreatitis    Acute pancreatitis likely related to alcohol.  Patient states she has not had any alcohol off.  She got discharged in June but did have some 2 nights ago.  This episode likely precipitated by alcohol.  CT abdomen pelvis is pending.  We will keep patient  n.p.o. IV fluids pain medications. COVID-19 infection positive patient is presently asymptomatic not hypoxic and febrile.  Check chest x-ray and COVID markers.  We will discussed with patient about starting remdesivir for asymptomatic COVID-19 infection.  Closely monitor respiratory status. History of alcoholism has not had any alcohol for the last month but did drink some wine 2 nights ago.  Closely monitor.  Thiamine. Macrocytosis -likely from alcoholism.  Check anemia panel with next blood draw.  Since patient has had acute pancreatitis with COVID infection will need close monitoring for any further worsening inpatient status.   DVT prophylaxis: Heparin. Code Status: Full code. Family Communication: Discussed with patient. Disposition Plan: Home. Consults called: None. Admission status: Inpatient.   Eduard Clos MD [facility] Pager [phone].  If 7PM-7AM, please contact night-coverage [email or web address] Password [facility]  02/25/2021, 4:26 AM

## 2021-02-25 NOTE — ED Notes (Signed)
Pt continues to be counseled on the importance of using her call light and not getting out of the ED stretcher. Pt continues to get out of ED stretcher and opening exam room door.

## 2021-02-25 NOTE — ED Notes (Signed)
Pt has been counseled several times on the importance of staying in the ED stretcher, attached to the cardiac monitor, and using her call light. Pt continues to come to ED exam room door, open door, and yell out into the hallway.

## 2021-02-25 NOTE — ED Notes (Signed)
IV nurse at the bedside at this time.

## 2021-02-25 NOTE — ED Notes (Signed)
Pt given lunch tray.

## 2021-02-25 NOTE — ED Notes (Signed)
This nurse entered the pt's room to find the pt standing upright on the other side of the stretcher, attached to the cardiac monitor. This nurse again counseled the pt on using her call light and staying in the ED stretcher for safety.

## 2021-02-25 NOTE — ED Provider Notes (Signed)
[facility]-EMERGENCY DEPT Provider Note   CSN: [id number] Arrival date & time: 02/24/21  2217     History Chief Complaint  Patient presents with   Abdominal Pain   Pancreatitis    KRISTYN OBYRNE is a 40 y.o. female.  HPI     This is a 40 year old female with a history of degenerative disc disease, substance abuse, alcohol abuse who presents with abdominal pain.  She reports epigastric pain that radiates to the bilateral flanks and back.  It is similar to when she had pancreatitis.  She states she has abstained from alcohol and has not had any alcohol recently.  She has had 2 to 3 days of symptoms.  She states that sharp.  Her pain is 10 out of 10.  She has not tried any medications.  She reports nausea.  No vomiting.  She states pain is somewhat worse with eating.  Past Medical History:  Diagnosis Date   Back pain, chronic    Coccygeal fracture (HCC)    Cystic fibrosis    DDD (degenerative disc disease)    Ovarian cyst    Substance abuse (HCC)    Thoracic compression fracture [facility])     Patient Active Problem List   Diagnosis Date Noted   Acute alcoholic pancreatitis 01/15/2021   ETOH abuse 01/15/2021    Past Surgical History:  Procedure Laterality Date   LAPAROSCOPY     OVARIAN CYST REMOVAL     TUBAL LIGATION       OB History   No obstetric history on file.     Family History  Problem Relation Age of Onset   Heart failure Mother    Diabetes Mother    Hypertension Father    Heart failure Other    Diabetes Other    Cancer Other     Social History   Tobacco Use   Smoking status: Every Day    Packs/day: 1.00    Years: 10.00    Pack years: 10.00    Types: Cigarettes   Smokeless tobacco: Never  Substance Use Topics   Alcohol use: Yes    Comment: occasional   Drug use: Not Currently    Types: Morphine, Oxycodone, Hydrocodone, Marijuana    Comment: opiate    Home Medications Prior to Admission medications   Medication Sig Start  Date End Date Taking? Authorizing Provider  acetaminophen (TYLENOL) 500 MG tablet Take 500-1,000 mg by mouth every 6 (six) hours as needed for mild pain.    [provider]    Allergies    Compazine [prochlorperazine] and Phenergan [promethazine hcl]  Review of Systems   Review of Systems  Constitutional:  Negative for fever.  Respiratory:  Negative for shortness of breath.   Cardiovascular:  Negative for chest pain.  Gastrointestinal:  Positive for abdominal pain and nausea. Negative for vomiting.  Genitourinary:  Negative for dysuria and hematuria.  All other systems reviewed and are negative.  Physical Exam Updated Vital Signs BP 134/88   Pulse 99   Temp 98.9 F (37.2 C) (Oral)   Resp 18   SpO2 98%   Physical Exam Vitals and nursing note reviewed.  Constitutional:      Appearance: She is well-developed. She is not ill-appearing.     Comments: Uncomfortable appearing but nontoxic  HENT:     Head: Normocephalic and atraumatic.  Eyes:     Pupils: Pupils are equal, round, and reactive to light.  Cardiovascular:     Rate  and Rhythm: Normal rate and regular rhythm.     Heart sounds: Normal heart sounds.  Pulmonary:     Effort: Pulmonary effort is normal. No respiratory distress.     Breath sounds: No wheezing.  Abdominal:     General: Bowel sounds are normal.     Palpations: Abdomen is soft.     Tenderness: There is abdominal tenderness in the epigastric area. There is no guarding or rebound.  Musculoskeletal:     Cervical back: Neck supple.  Skin:    General: Skin is warm and dry.  Neurological:     Mental Status: She is alert and oriented to person, place, and time.  Psychiatric:        Mood and Affect: Mood normal.    ED Results / Procedures / Treatments   Labs (all labs ordered are listed, but only abnormal results are displayed) Labs Reviewed  CBC WITH DIFFERENTIAL/PLATELET - Abnormal; Notable for the following components:      Result Value   WBC  12.4 (*)    Hemoglobin 15.2 (*)    MCV 100.4 (*)    MCH 34.1 (*)    Platelets 421 (*)    Neutro Abs 8.7 (*)    All other components within normal limits  COMPREHENSIVE METABOLIC PANEL - Abnormal; Notable for the following components:   Total Protein 8.5 (*)    AST 58 (*)    Alkaline Phosphatase 237 (*)    All other components within normal limits  LIPASE, BLOOD - Abnormal; Notable for the following components:   Lipase 787 (*)    All other components within normal limits  URINALYSIS, ROUTINE W REFLEX MICROSCOPIC - Abnormal; Notable for the following components:   Ketones, ur 20 (*)    Nitrite POSITIVE (*)    Bacteria, UA MANY (*)    All other components within normal limits  RESP PANEL BY RT-PCR (FLU A&B, COVID) ARPGX2  PREGNANCY, URINE    EKG None  Radiology No results found.  Procedures Procedures   Medications Ordered in ED Medications  0.9 %  sodium chloride infusion (has no administration in time range)  morphine 4 MG/ML injection 4 mg (4 mg Intravenous Given 02/25/21 0143)  ondansetron (ZOFRAN) injection 4 mg (4 mg Intravenous Given 02/25/21 0143)  HYDROmorphone (DILAUDID) injection 1 mg (1 mg Intravenous Given 02/25/21 0239)    ED Course  I have reviewed the triage vital signs and the nursing notes.  Pertinent labs & imaging results that were available during my care of the patient were reviewed by me and considered in my medical decision making (see chart for details).    MDM Rules/Calculators/A&P                           Patient presents with abdominal pain.  Reports symptoms are similar to her prior episode of pancreatitis.  However, she states that she has not been recently drinking alcohol.  She is uncomfortable appearing but nontoxic.  She is afebrile.  Vital signs are reassuring.  Patient was given pain and nausea medication as well as fluids.  Labs obtained.  Slight leukocytosis to 12.4.  No significant metabolic derangements.  Lipase today is 787.  This  is higher than her prior admission.  I did review her chart.  She has a prior right upper quadrant ultrasound that did not show any cholecystitis.  CT scan showed evidence of pancreatitis.  Patient was made NPO.  We  will plan for admission for pain control and fluids.  Final Clinical Impression(s) / ED Diagnoses Final diagnoses:  Acute pancreatitis, unspecified complication status, unspecified pancreatitis type    Rx / DC Orders ED Discharge Orders     None        Shon Baton, MD 02/25/21 [phone]

## 2021-02-25 NOTE — Progress Notes (Signed)
PROGRESS NOTE  Marisa Palmer DGL:[id number] DOB: Mar 24, 1981   PCP: Patient, No Pcp Per (Inactive)  Patient is from: Home  DOA: 02/24/2021 LOS: 0  Chief complaints:  Chief Complaint  Patient presents with   Abdominal Pain   Pancreatitis     Brief Narrative / Interim history: 40 year old F with PMH of alcoholic pancreatitis, anxiety, depression, stress and chronic pain presenting with acutely worsening epigastric pain radiating to her back and nausea after "a glass of wine", and admitted for acute uncomplicated pancreatitis.  Incidentally tested positive for COVID-19.  She is unvaccinated.  No respiratory symptoms.  Subjective: Seen and examined earlier this morning.  Reports abdominal pain, nausea and vomiting.  She had severe pain that has improved with pain medication.  Pain is currently 5/10.  Radiates to her back.  She had an episode of emesis when she tried to take a multivitamin.  She also reports feeling anxious.  She likes to try clear liquid diet.  She denies chest pain, dyspnea, diarrhea or UTI symptoms.  Objective: Vitals:   02/25/21 1013 02/25/21 1030 02/25/21 1045 02/25/21 1315  BP: 127/79 117/71 114/73 109/76  Pulse: (!) 110 (!) 102 94 94  Resp: 20 13 19 20   Temp: 98 F (36.7 C)     TempSrc:      SpO2: 95% 92% 97% 99%   No intake or output data in the 24 hours ending 02/25/21 1425 There were no vitals filed for this visit.  Examination:  GENERAL: No apparent distress.  Nontoxic. HEENT: MMM.  Vision and hearing grossly intact.  NECK: Supple.  No apparent JVD.  RESP: 98% on RA.  No IWOB.  Fair aeration bilaterally. CVS:  RRR. Heart sounds normal.  ABD/GI/GU: BS+. Abd soft.  Tenderness across upper abdomen. MSK/EXT:  Moves extremities. No apparent deformity. No edema.  SKIN: no apparent skin lesion or wound NEURO: Awake, alert and oriented appropriately.  No apparent focal neuro deficit. PSYCH: Calm. Normal affect.   Procedures:  None  Microbiology  summarized: COVID-19 PCR positive. Influenza PCR negative.  Assessment & Plan: Acute alcoholic pancreatitis without infection, necrosis or pseudocyst-reportedly sober for about 2 months, and had "a glass of wine".  Lipase 787. -Continue IV fluid, IV analgesics and antiemetics -Start clear liquid diet -Monitor LFT and lipase  COVID-19 infection-incidental finding.  No respiratory symptoms.  She is unvaccinated.  Saturating in upper 90s to 100% on RA.  She briefly decided to upper 80s when she fell asleep.  No history of OSA.  Started on remdesivir. -Continue remdesivir for 3 days -Follow inflammatory markers  Mildly elevated AST/ALT: Likely due to alcohol.  Could be due to COVID as well. -Check CK in the morning -Recheck LFT in the morning  Alcohol use disorder: Reportedly sober for 2 months and had "a glass of wine". -Start CIWA monitoring -Thiamine, folic acid and multivitamin  Leukocytosis/bandemia: Likely demargination -Recheck in the morning  Sinus tachycardia: Likely from pain.  Could be alcohol withdrawal.  Improved. -Check TSH -IV fluid as above.  Anxiety and depression: Not on medications.  Bacteriuria: UA with many bacteria.  Denies UTI symptoms. -No need for treatment  Possible cirrhosis-likely from alcohol. -Discussed finding with patient and advised to stop alcohol -Encourage lifestyle change to lose weight   There is no height or weight on file to calculate BMI.         DVT prophylaxis:  heparin injection 5,000 Units Start: 02/25/21 0600  Code Status: Full code Family Communication: Patient and/or  RN. Available if any question.  Level of care: Progressive Status is: Inpatient  Remains inpatient appropriate because:Hemodynamically unstable, Ongoing active pain requiring inpatient pain management, IV treatments appropriate due to intensity of illness or inability to take PO, and Inpatient level of care appropriate due to severity of illness  Dispo: The  patient is from: Home              Anticipated d/c is to: Home              Patient currently is not medically stable to d/c.   Difficult to place patient No       Consultants:  None   Sch Meds:  Scheduled Meds:  folic acid  1 mg Oral Daily   heparin  5,000 Units Subcutaneous Q8H   multivitamin with minerals  1 tablet Oral Daily   thiamine  100 mg Oral Daily   Or   thiamine  100 mg Intravenous Daily   Continuous Infusions:  lactated ringers 200 mL/hr at 02/25/21 0335   [START ON 02/26/2021] remdesivir 100 mg in NS 100 mL     PRN Meds:.HYDROmorphone (DILAUDID) injection, LORazepam **OR** LORazepam, ondansetron (ZOFRAN) IV  Antimicrobials: Anti-infectives (From admission, onward)    Start     Dose/Rate Route Frequency Ordered Stop   02/26/21 1000  remdesivir 100 mg in sodium chloride 0.9 % 100 mL IVPB  Status:  Discontinued       See Hyperspace for full Linked Orders Report.   100 mg 200 mL/hr over 30 Minutes Intravenous Daily 02/25/21 0428 02/25/21 0432   02/26/21 1000  remdesivir 100 mg in sodium chloride 0.9 % 100 mL IVPB       See Hyperspace for full Linked Orders Report.   100 mg 200 mL/hr over 30 Minutes Intravenous Daily 02/25/21 0432 02/28/21 0959   02/25/21 0445  remdesivir 100 mg in sodium chloride 0.9 % 100 mL IVPB       See Hyperspace for full Linked Orders Report.   100 mg 200 mL/hr over 30 Minutes Intravenous Every 30 min 02/25/21 0432 02/25/21 0636   02/25/21 0430  remdesivir 200 mg in sodium chloride 0.9% 250 mL IVPB  Status:  Discontinued       See Hyperspace for full Linked Orders Report.   200 mg 580 mL/hr over 30 Minutes Intravenous Once 02/25/21 0428 02/25/21 0432        I have personally reviewed the following labs and images: CBC: Recent Labs  Lab 02/25/21 0123 02/25/21 0510  WBC 12.4* 11.9*  NEUTROABS 8.7* 8.1*  HGB 15.2* 14.2  HCT 44.8 42.5  MCV 100.4* 100.5*  PLT 421* 399   BMP &GFR Recent Labs  Lab 02/25/21 0123  02/25/21 0510  NA 137  --   K 3.5  --   CL 100  --   CO2 25  --   GLUCOSE 98  --   BUN 6  --   CREATININE 0.49 0.32*  CALCIUM 9.3  --    CrCl cannot be calculated (Unknown ideal weight.). Liver & Pancreas: Recent Labs  Lab 02/25/21 0123  AST 58*  ALT 32  ALKPHOS 237*  BILITOT 1.2  PROT 8.5*  ALBUMIN 4.0   Recent Labs  Lab 02/25/21 0123  LIPASE 787*   No results for input(s): AMMONIA in the last 168 hours. Diabetic: No results for input(s): HGBA1C in the last 72 hours. Recent Labs  Lab 02/25/21 0758  GLUCAP 101*   Cardiac Enzymes: No results  for input(s): CKTOTAL, CKMB, CKMBINDEX, TROPONINI in the last 168 hours. No results for input(s): PROBNP in the last 8760 hours. Coagulation Profile: No results for input(s): INR, PROTIME in the last 168 hours. Thyroid Function Tests: No results for input(s): TSH, T4TOTAL, FREET4, T3FREE, THYROIDAB in the last 72 hours. Lipid Profile: No results for input(s): CHOL, HDL, LDLCALC, TRIG, CHOLHDL, LDLDIRECT in the last 72 hours. Anemia Panel: No results for input(s): VITAMINB12, FOLATE, FERRITIN, TIBC, IRON, RETICCTPCT in the last 72 hours. Urine analysis:    Component Value Date/Time   COLORURINE YELLOW 02/25/2021 0123   APPEARANCEUR CLEAR 02/25/2021 0123   LABSPEC 1.009 02/25/2021 0123   PHURINE 6.0 02/25/2021 0123   GLUCOSEU NEGATIVE 02/25/2021 0123   HGBUR NEGATIVE 02/25/2021 0123   BILIRUBINUR NEGATIVE 02/25/2021 0123   KETONESUR 20 (A) 02/25/2021 0123   PROTEINUR NEGATIVE 02/25/2021 0123   UROBILINOGEN 1.0 02/08/2013 0422   NITRITE POSITIVE (A) 02/25/2021 0123   LEUKOCYTESUR NEGATIVE 02/25/2021 0123   Sepsis Labs: Invalid input(s): PROCALCITONIN, LACTICIDVEN  Microbiology: Recent Results (from the past 240 hour(s))  Resp Panel by RT-PCR (Flu A&B, Covid) Nasopharyngeal Swab     Status: Abnormal   Collection Time: 02/25/21  3:08 AM   Specimen: Nasopharyngeal Swab; Nasopharyngeal(NP) swabs in vial transport  medium  Result Value Ref Range Status   SARS Coronavirus 2 by RT PCR POSITIVE (A) NEGATIVE Final    Comment: RESULT CALLED TO, READ BACK BY AND VERIFIED WITH: KERSCHERI,Q RN @0425  ON 02/25/21 JAKSON,K (NOTE) SARS-CoV-2 target nucleic acids are DETECTED.  The SARS-CoV-2 RNA is generally detectable in upper respiratory specimens during the acute phase of infection. Positive results are indicative of the presence of the identified virus, but do not rule out bacterial infection or co-infection with other pathogens not detected by the test. Clinical correlation with patient history and other diagnostic information is necessary to determine patient infection status. The expected result is Negative.  Fact Sheet for Patients: 04/27/21  Fact Sheet for Healthcare Providers: BloggerCourse.com  This test is not yet approved or cleared by the [place]ious[place] FDA and  has been authorized for detection and/or diagnosis of SARS-CoV-2 by FDA under an Emergency Use Authorization (EUA).  This EUA will remain in effect (meaning this test  can be used) for the duration of  the COVID-19 declaration under Section 564(b)(1) of the Act, 21 U.S.C. section 360bbb-3(b)(1), unless the authorization is terminated or revoked sooner.     Influenza A by PCR NEGATIVE NEGATIVE Final   Influenza B by PCR NEGATIVE NEGATIVE Final    Comment: (NOTE) The Xpert Xpress SARS-CoV-2/FLU/RSV plus assay is intended as an aid in the diagnosis of influenza from Nasopharyngeal swab specimens and should not be used as a sole basis for treatment. Nasal washings and aspirates are unacceptable for Xpert Xpress SARS-CoV-2/FLU/RSV testing.  Fact Sheet for Patients: [place]  Fact Sheet for Healthcare Providers: BloggerCourse.com  This test is not yet approved or cleared by the SeriousBroker.it FDA and has been  authorized for detection and/or diagnosis of SARS-CoV-2 by FDA under an Emergency Use Authorization (EUA). This EUA will remain in effect (meaning this test can be used) for the duration of the COVID-19 declaration under Section 564(b)(1) of the Act, 21 U.S.C. section 360bbb-3(b)(1), unless the authorization is terminated or revoked.  Performed at [facility], [address] [place], [place]     Radiology Studies: CT ABDOMEN PELVIS W CONTRAST  Result Date: 02/25/2021 CLINICAL DATA:  Pancreatitis FLAIR for  2 days EXAM: CT ABDOMEN AND PELVIS WITH CONTRAST TECHNIQUE: Multidetector CT imaging of the abdomen and pelvis was performed using the standard protocol following bolus administration of intravenous contrast. CONTRAST:  38mL OMNIPAQUE IOHEXOL 350 MG/ML SOLN COMPARISON:  01/14/2021 FINDINGS: Lower chest:  Mild atelectasis at the bases Hepatobiliary: Similar pattern of patchy dense fatty infiltration of the liver with generalized lobulation of the liver surface. Caudate lobe is enlarged and compresses the infra hepatic cava.No evidence of biliary obstruction or stone. Pancreas: Mild fat stranding along the pancreatic head and body without necrosis or collection. Spleen: Unremarkable. Adrenals/Urinary Tract: Negative adrenals. No hydronephrosis or stone. Unremarkable bladder. Stomach/Bowel:  No obstruction. No appendicitis. Vascular/Lymphatic: No acute vascular abnormality. No mass or adenopathy. Reproductive:No pathologic findings.  Probable tubal ligation. Other: No ascites or pneumoperitoneum. Musculoskeletal: No acute abnormalities. IMPRESSION: 1. Acute edematous pancreatitis without necrosis or collection. 2. Heterogeneous fatty infiltration of the liver which shows lobulated surface-possible cirrhosis. Electronically Signed   By: Marnee Spring M.D.   On: 02/25/2021 05:37   DG CHEST PORT 1 VIEW  Result Date: 02/25/2021 CLINICAL DATA:  40 year old female with  history of weakness shortness of breath. COVID infection. EXAM: PORTABLE CHEST 1 VIEW COMPARISON:  No priors. FINDINGS: Patchy ill-defined opacities and areas of subtle interstitial prominence are noted in the lower lungs bilaterally. Linear opacity in the left mid lung. No pleural effusions. No pneumothorax. No evidence of pulmonary edema. No definite suspicious appearing pulmonary nodules or masses are noted. Heart size is normal. Upper mediastinal contours are within normal limits. IMPRESSION: 1. Very subtle findings in the lower lungs which could be indicative of developing multilobar bilateral pneumonia as can be seen in the setting of COVID infection. Electronically Signed   By: Trudie Reed M.D.   On: 02/25/2021 04:58      Shivaay Stormont T. Milani Lowenstein [facility]  If 7PM-7AM, please contact night-coverage [email or web address] 02/25/2021, 2:25 PM

## 2021-02-26 DIAGNOSIS — F1010 Alcohol abuse, uncomplicated: Secondary | ICD-10-CM

## 2021-02-26 LAB — COMPREHENSIVE METABOLIC PANEL
ALT: 23 U/L (ref 0–44)
AST: 35 U/L (ref 15–41)
Albumin: 3.4 g/dL — ABNORMAL LOW (ref 3.5–5.0)
Alkaline Phosphatase: 169 U/L — ABNORMAL HIGH (ref 38–126)
Anion gap: 4 — ABNORMAL LOW (ref 5–15)
BUN: <5 mg/dL — ABNORMAL LOW (ref 6–20)
CO2: 30 mmol/L (ref 22–32)
Calcium: 8.7 mg/dL — ABNORMAL LOW (ref 8.9–10.3)
Chloride: 103 mmol/L (ref 98–111)
Creatinine, Ser: 0.31 mg/dL — ABNORMAL LOW (ref 0.44–1.00)
GFR, Estimated: >60 mL/min (ref >60)
Glucose, Bld: 77 mg/dL (ref 70–99)
Potassium: 3.6 mmol/L (ref 3.5–5.1)
Sodium: 137 mmol/L (ref 135–145)
Total Bilirubin: 1 mg/dL (ref 0.3–1.2)
Total Protein: 7 g/dL (ref 6.5–8.1)

## 2021-02-26 LAB — CBC WITH DIFFERENTIAL/PLATELET
Abs Immature Granulocytes: 0.04 10*3/uL (ref 0.00–0.07)
Basophils Absolute: 0 10*3/uL (ref 0.0–0.1)
Basophils Relative: 0 %
Eosinophils Absolute: 0.1 10*3/uL (ref 0.0–0.5)
Eosinophils Relative: 1 %
HCT: 41.4 % (ref 36.0–46.0)
Hemoglobin: 13.5 g/dL (ref 12.0–15.0)
Immature Granulocytes: 0 %
Lymphocytes Relative: 25 %
Lymphs Abs: 2.7 10*3/uL (ref 0.7–4.0)
MCH: 34 pg (ref 26.0–34.0)
MCHC: 32.6 g/dL (ref 30.0–36.0)
MCV: 104.3 fL — ABNORMAL HIGH (ref 80.0–100.0)
Monocytes Absolute: 0.9 10*3/uL (ref 0.1–1.0)
Monocytes Relative: 9 %
Neutro Abs: 6.8 10*3/uL (ref 1.7–7.7)
Neutrophils Relative %: 65 %
Platelets: 371 10*3/uL (ref 150–400)
RBC: 3.97 MIL/uL (ref 3.87–5.11)
RDW: 12.8 % (ref 11.5–15.5)
WBC: 10.6 10*3/uL — ABNORMAL HIGH (ref 4.0–10.5)
nRBC: 0 % (ref 0.0–0.2)

## 2021-02-26 LAB — GLUCOSE, CAPILLARY
Glucose-Capillary: 78 mg/dL (ref 70–99)
Glucose-Capillary: 82 mg/dL (ref 70–99)

## 2021-02-26 LAB — LIPASE, BLOOD: Lipase: 182 U/L — ABNORMAL HIGH (ref 11–51)

## 2021-02-26 LAB — MAGNESIUM: Magnesium: 2 mg/dL (ref 1.7–2.4)

## 2021-02-26 LAB — CK: Total CK: 37 U/L — ABNORMAL LOW (ref 38–234)

## 2021-02-26 LAB — TSH: TSH: 7.723 u[IU]/mL — ABNORMAL HIGH (ref 0.350–4.500)

## 2021-02-26 LAB — C-REACTIVE PROTEIN: CRP: 4.8 mg/dL — ABNORMAL HIGH (ref <1.0)

## 2021-02-26 LAB — PHOSPHORUS: Phosphorus: 2.8 mg/dL (ref 2.5–4.6)

## 2021-02-26 LAB — D-DIMER, QUANTITATIVE: D-Dimer, Quant: 2.08 ug/mL-FEU — ABNORMAL HIGH (ref 0.00–0.50)

## 2021-02-26 MED ORDER — LACTATED RINGERS IV SOLN: INTRAVENOUS | Status: DC

## 2021-02-26 MED ORDER — OXYCODONE HCL 5 MG PO TABS
5.0000 mg | ORAL_TABLET | Freq: Four times a day (QID) | ORAL | Status: DC | PRN
  Administered 2021-02-26 – 2021-02-27 (×4): 5 mg via ORAL
  Filled 2021-02-26 (×4): qty 1

## 2021-02-26 MED ORDER — HYDROMORPHONE HCL 1 MG/ML IJ SOLN
0.5000 mg | INTRAMUSCULAR | Status: DC | PRN
  Administered 2021-02-26 (×2): 0.5 mg via INTRAVENOUS
  Filled 2021-02-26 (×2): qty 0.5

## 2021-02-26 MED ORDER — MELATONIN 5 MG PO TABS
5.0000 mg | ORAL_TABLET | Freq: Every evening | ORAL | Status: DC | PRN
  Administered 2021-02-26: 5 mg via ORAL
  Filled 2021-02-26: qty 1

## 2021-02-26 NOTE — Progress Notes (Signed)
PROGRESS NOTE  Marisa Palmer BBC:[id number] DOB: 11-18-1980   PCP: Patient, No Pcp Per (Inactive).  TOC consulted for PCP.  Patient is from: Home  DOA: 02/24/2021 LOS: 1  Chief complaints:  Chief Complaint  Patient presents with   Abdominal Pain   Pancreatitis     Brief Narrative / Interim history: 40 year old F with PMH of alcoholic pancreatitis, anxiety, depression, stress and chronic pain presenting with acutely worsening epigastric pain radiating to her back and nausea after "a glass of wine", and admitted for acute uncomplicated pancreatitis.  Incidentally tested positive for COVID-19.  She is unvaccinated.  No respiratory symptoms.  Pancreatitis improving.  Subjective: Seen and examined earlier this morning.  No major events overnight or this morning.  He says she did not have good sleep.  Continues to endorse significant pain.  She rates her pain 6/10 over epigastric and LUQ area radiating to her back but she attributes this to hunger pain.  She would like to try full liquid diet.  Denies chest pain, dyspnea, cough, nausea, vomiting or UTI symptoms.  Objective: Vitals:   02/26/21 1048 02/26/21 1241 02/26/21 1302 02/26/21 1415  BP:   115/68   Pulse:   (!) 108   Resp: 18 18 16 18   Temp:   98.1 F (36.7 C)   TempSrc:   Oral   SpO2:   96%   Weight:      Height:        Intake/Output Summary (Last 24 hours) at 02/26/2021 1522 Last data filed at 02/26/2021 0400 Gross per 24 hour  Intake 2000 ml  Output --  Net 2000 ml   Filed Weights   02/25/21 1545  Weight: 74.1 kg    Examination:  GENERAL: No apparent distress.  Nontoxic. HEENT: MMM.  Vision and hearing grossly intact.  NECK: Supple.  No apparent JVD.  RESP: 99% on RA.  No IWOB.  Fair aeration bilaterally. CVS:  RRR. Heart sounds normal.  ABD/GI/GU: BS+. Abd soft.  Tenderness across upper abdomen MSK/EXT:  Moves extremities. No apparent deformity. No edema.  SKIN: no apparent skin lesion or wound NEURO: Awake and  alert. Oriented appropriately.  No apparent focal neuro deficit. PSYCH: Calm. Normal affect.   Procedures:  None  Microbiology summarized: COVID-19 PCR positive. Influenza PCR negative.  Assessment & Plan: Acute alcoholic pancreatitis without infection, necrosis or pseudocyst-reportedly sober for about 2 months, and had "a glass of wine".  Lipase 787>> 182. -Continue IV fluid, IV analgesics and antiemetics -Advance to full liquid diet -Monitor LFT and lipase  COVID-19 infection-incidental finding.  No respiratory symptoms.  She is unvaccinated.  Saturating in upper 90s to 100% on RA.  She briefly decided to upper 80s when she fell asleep.  No history of OSA.  Started on remdesivir.  Inflammatory markers slightly elevated. -Continue remdesivir for 3 days  Mildly elevated AST/ALT: Likely due to alcohol or COVID-19.  CK within normal.  Resolved.  Alcohol use disorder: Reportedly sober for 2 months and had "a glass of wine". -Continue CIWA monitoring -Thiamine, folic acid and multivitamin  Leukocytosis/bandemia: Likely demargination.  Improved without antibiotics. -Recheck in the morning  Sinus tachycardia: Likely from pain.  Could be alcohol withdrawal.  TSH 7.723.  Improved. -Check free T4 and repeat TSH in the morning  Anxiety and depression: Not on medications. -As needed Ativan as above  Bacteriuria: UA with many bacteria.  Denies UTI symptoms. -No need for treatment  Possible cirrhosis-likely from alcohol. -Discussed finding with patient  and advised to stop alcohol -Encourage lifestyle change to lose weight  Macrocytosis: Likely from alcohol. -Multivitamin and folic acid   Body mass index is 26.37 kg/m.         DVT prophylaxis:  heparin injection 5,000 Units Start: 02/25/21 0600  Code Status: Full code Family Communication: Patient and/or RN. Available if any question.  Level of care: Progressive.  Change level of care to MedSurg. Status is:  Inpatient  Remains inpatient appropriate because:Hemodynamically unstable, Ongoing active pain requiring inpatient pain management, IV treatments appropriate due to intensity of illness or inability to take PO, and Inpatient level of care appropriate due to severity of illness  Dispo: The patient is from: Home              Anticipated d/c is to: Home              Patient currently is not medically stable to d/c.   Difficult to place patient No       Consultants:  None   Sch Meds:  Scheduled Meds:  folic acid  1 mg Oral Daily   heparin  5,000 Units Subcutaneous Q8H   multivitamin with minerals  1 tablet Oral Daily   thiamine  100 mg Oral Daily   Or   thiamine  100 mg Intravenous Daily   Continuous Infusions:  lactated ringers 150 mL/hr at 02/26/21 0954   remdesivir 100 mg in NS 100 mL 100 mg (02/26/21 1008)   PRN Meds:.HYDROmorphone (DILAUDID) injection, LORazepam **OR** LORazepam, ondansetron (ZOFRAN) IV, oxyCODONE  Antimicrobials: Anti-infectives (From admission, onward)    Start     Dose/Rate Route Frequency Ordered Stop   02/26/21 1000  remdesivir 100 mg in sodium chloride 0.9 % 100 mL IVPB  Status:  Discontinued       See Hyperspace for full Linked Orders Report.   100 mg 200 mL/hr over 30 Minutes Intravenous Daily 02/25/21 0428 02/25/21 0432   02/26/21 1000  remdesivir 100 mg in sodium chloride 0.9 % 100 mL IVPB       See Hyperspace for full Linked Orders Report.   100 mg 200 mL/hr over 30 Minutes Intravenous Daily 02/25/21 0432 02/28/21 0959   02/25/21 0445  remdesivir 100 mg in sodium chloride 0.9 % 100 mL IVPB       See Hyperspace for full Linked Orders Report.   100 mg 200 mL/hr over 30 Minutes Intravenous Every 30 min 02/25/21 0432 02/25/21 0636   02/25/21 0430  remdesivir 200 mg in sodium chloride 0.9% 250 mL IVPB  Status:  Discontinued       See Hyperspace for full Linked Orders Report.   200 mg 580 mL/hr over 30 Minutes Intravenous Once 02/25/21 0428  02/25/21 0432        I have personally reviewed the following labs and images: CBC: Recent Labs  Lab 02/25/21 0123 02/25/21 0510 02/26/21 0335  WBC 12.4* 11.9* 10.6*  NEUTROABS 8.7* 8.1* 6.8  HGB 15.2* 14.2 13.5  HCT 44.8 42.5 41.4  MCV 100.4* 100.5* 104.3*  PLT 421* 399 371   BMP &GFR Recent Labs  Lab 02/25/21 0123 02/25/21 0510 02/26/21 0335  NA 137  --  137  K 3.5  --  3.6  CL 100  --  103  CO2 25  --  30  GLUCOSE 98  --  77  BUN 6  --  <5*  CREATININE 0.49 0.32* 0.31*  CALCIUM 9.3  --  8.7*  MG  --   --  2.0  PHOS  --   --  2.8   Estimated Creatinine Clearance: 95.3 mL/min (A) (by C-G formula based on SCr of 0.31 mg/dL (L)). Liver & Pancreas: Recent Labs  Lab 02/25/21 0123 02/26/21 0335  AST 58* 35  ALT 32 23  ALKPHOS 237* 169*  BILITOT 1.2 1.0  PROT 8.5* 7.0  ALBUMIN 4.0 3.4*   Recent Labs  Lab 02/25/21 0123 02/26/21 0335  LIPASE 787* 182*   No results for input(s): AMMONIA in the last 168 hours. Diabetic: No results for input(s): HGBA1C in the last 72 hours. Recent Labs  Lab 02/25/21 0758 02/25/21 1644 02/25/21 2135 02/26/21 0751  GLUCAP 101* 90 87 78   Cardiac Enzymes: Recent Labs  Lab 02/26/21 0335  CKTOTAL 37*   No results for input(s): PROBNP in the last 8760 hours. Coagulation Profile: No results for input(s): INR, PROTIME in the last 168 hours. Thyroid Function Tests: Recent Labs    02/26/21 0335  TSH 7.723*   Lipid Profile: No results for input(s): CHOL, HDL, LDLCALC, TRIG, CHOLHDL, LDLDIRECT in the last 72 hours. Anemia Panel: No results for input(s): VITAMINB12, FOLATE, FERRITIN, TIBC, IRON, RETICCTPCT in the last 72 hours. Urine analysis:    Component Value Date/Time   COLORURINE YELLOW 02/25/2021 0123   APPEARANCEUR CLEAR 02/25/2021 0123   LABSPEC 1.009 02/25/2021 0123   PHURINE 6.0 02/25/2021 0123   GLUCOSEU NEGATIVE 02/25/2021 0123   HGBUR NEGATIVE 02/25/2021 0123   BILIRUBINUR NEGATIVE 02/25/2021 0123    KETONESUR 20 (A) 02/25/2021 0123   PROTEINUR NEGATIVE 02/25/2021 0123   UROBILINOGEN 1.0 02/08/2013 0422   NITRITE POSITIVE (A) 02/25/2021 0123   LEUKOCYTESUR NEGATIVE 02/25/2021 0123   Sepsis Labs: Invalid input(s): PROCALCITONIN, LACTICIDVEN  Microbiology: Recent Results (from the past 240 hour(s))  Resp Panel by RT-PCR (Flu A&B, Covid) Nasopharyngeal Swab     Status: Abnormal   Collection Time: 02/25/21  3:08 AM   Specimen: Nasopharyngeal Swab; Nasopharyngeal(NP) swabs in vial transport medium  Result Value Ref Range Status   SARS Coronavirus 2 by RT PCR POSITIVE (A) NEGATIVE Final    Comment: RESULT CALLED TO, READ BACK BY AND VERIFIED WITH: KERSCHERI,Q RN @0425  ON 02/25/21 JAKSON,K (NOTE) SARS-CoV-2 target nucleic acids are DETECTED.  The SARS-CoV-2 RNA is generally detectable in upper respiratory specimens during the acute phase of infection. Positive results are indicative of the presence of the identified virus, but do not rule out bacterial infection or co-infection with other pathogens not detected by the test. Clinical correlation with patient history and other diagnostic information is necessary to determine patient infection status. The expected result is Negative.  Fact Sheet for Patients: 04/27/21  Fact Sheet for Healthcare Providers: BloggerCourse.com  This test is not yet approved or cleared by the [employer]iousBroker.[place] FDA and  has been authorized for detection and/or diagnosis of SARS-CoV-2 by FDA under an Emergency Use Authorization (EUA).  This EUA will remain in effect (meaning this test  can be used) for the duration of  the COVID-19 declaration under Section 564(b)(1) of the Act, 21 U.S.C. section 360bbb-3(b)(1), unless the authorization is terminated or revoked sooner.     Influenza A by PCR NEGATIVE NEGATIVE Final   Influenza B by PCR NEGATIVE NEGATIVE Final    Comment: (NOTE) The Xpert Xpress  SARS-CoV-2/FLU/RSV plus assay is intended as an aid in the diagnosis of influenza from Nasopharyngeal swab specimens and should not be used as a sole basis for treatment. Nasal washings and aspirates are unacceptable for  Xpert Xpress SARS-CoV-2/FLU/RSV testing.  Fact Sheet for Patients: BloggerCourse.comhttps://www.fda.gov/media/152166/download  Fact Sheet for Healthcare Providers: SeriousBroker.ithttps://www.fda.gov/media/152162/download  This test is not yet approved or cleared by the [place] FDA and has been authorized for detection and/or diagnosis of SARS-CoV-2 by FDA under an Emergency Use Authorization (EUA). This EUA will remain in effect (meaning this test can be used) for the duration of the COVID-19 declaration under Section 564(b)(1) of the Act, 21 U.S.C. section 360bbb-3(b)(1), unless the authorization is terminated or revoked.  Performed at [facility], [address] [place], [place] [address]     Radiology Studies: No results found.    Richele Strand T. Ricardo Schubach [facility]  If 7PM-7AM, please contact night-coverage [email or web address] 02/26/2021, 3:22 PM

## 2021-02-27 DIAGNOSIS — U071 COVID-19: Secondary | ICD-10-CM | POA: Diagnosis present

## 2021-02-27 DIAGNOSIS — D7589 Other specified diseases of blood and blood-forming organs: Secondary | ICD-10-CM

## 2021-02-27 DIAGNOSIS — R7989 Other specified abnormal findings of blood chemistry: Secondary | ICD-10-CM

## 2021-02-27 LAB — COMPREHENSIVE METABOLIC PANEL
ALT: 33 U/L (ref 0–44)
AST: 90 U/L — ABNORMAL HIGH (ref 15–41)
Albumin: 3 g/dL — ABNORMAL LOW (ref 3.5–5.0)
Alkaline Phosphatase: 206 U/L — ABNORMAL HIGH (ref 38–126)
Anion gap: 9 (ref 5–15)
BUN: <5 mg/dL — ABNORMAL LOW (ref 6–20)
CO2: 28 mmol/L (ref 22–32)
Calcium: 8.6 mg/dL — ABNORMAL LOW (ref 8.9–10.3)
Chloride: 100 mmol/L (ref 98–111)
Creatinine, Ser: 0.39 mg/dL — ABNORMAL LOW (ref 0.44–1.00)
GFR, Estimated: >60 mL/min (ref >60)
Glucose, Bld: 82 mg/dL (ref 70–99)
Potassium: 3.5 mmol/L (ref 3.5–5.1)
Sodium: 137 mmol/L (ref 135–145)
Total Bilirubin: 0.9 mg/dL (ref 0.3–1.2)
Total Protein: 6.5 g/dL (ref 6.5–8.1)

## 2021-02-27 LAB — PHOSPHORUS: Phosphorus: 3.6 mg/dL (ref 2.5–4.6)

## 2021-02-27 LAB — GLUCOSE, CAPILLARY: Glucose-Capillary: 92 mg/dL (ref 70–99)

## 2021-02-27 LAB — TSH: TSH: 5.464 u[IU]/mL — ABNORMAL HIGH (ref 0.350–4.500)

## 2021-02-27 LAB — LIPASE, BLOOD: Lipase: 191 U/L — ABNORMAL HIGH (ref 11–51)

## 2021-02-27 LAB — MAGNESIUM: Magnesium: 2 mg/dL (ref 1.7–2.4)

## 2021-02-27 LAB — T4, FREE: Free T4: 1.11 ng/dL (ref 0.61–1.12)

## 2021-02-27 MED ORDER — ADULT MULTIVITAMIN W/MINERALS CH: 1.0000 | ORAL_TABLET | Freq: Every day | ORAL | Status: DC

## 2021-02-27 MED ORDER — THIAMINE HCL 100 MG PO TABS: 100.0000 mg | ORAL_TABLET | Freq: Every day | ORAL | Status: DC

## 2021-02-27 MED ORDER — FOLIC ACID 1 MG PO TABS: 1.0000 mg | ORAL_TABLET | Freq: Every day | ORAL | Status: DC

## 2021-02-27 NOTE — Progress Notes (Signed)
Pt left room before RN could go over Discharge paperwork. Pt left before last remdesivir dose. MD notified.

## 2021-02-27 NOTE — TOC Transition Note (Signed)
Transition of Care [facility]) - CM/SW Discharge Note   Patient Details  Name: Marisa Palmer MRN: [id number] Date of Birth: 11-08-1980  Transition of [facility]) CM/SW Contact:  Golda Acre, RN Phone Number: 02/27/2021, 8:41 AM   Clinical Narrative:    Information for substance recovery and help given to patient with instructions.  Patient also instructed to call the 800 number on the back of her medicaid care to be assigned a pcp.         Patient Goals and CMS Choice        Discharge Placement                       Discharge Plan and Services                                     Social Determinants of Health (SDOH) Interventions     Readmission Risk Interventions No flowsheet data found.

## 2021-02-27 NOTE — Discharge Summary (Signed)
Physician Discharge Summary  Marisa Palmer ZOX:[id number] DOB: [birth year] DOA: 02/24/2021  PCP: Recently approved for Medicaid.  Patient to establish care with PCP  Admit date: 02/24/2021 Discharge date: 02/27/2021  Admitted From: Home. Disposition: Home.   Recommendations for Outpatient Follow-up:  Follow ups as below. Please obtain CBC/CMP/Mag at follow up Recommend checking TSH in 4 to 6 weeks Please follow up on the following pending results: None  Home Health: Not indicated Equipment/Devices: Not indicated  Discharge Condition: Stable CODE STATUS: Full code   Hospital Course: 40 year old F with PMH of alcoholic pancreatitis, anxiety, depression, stress and chronic pain presenting with acutely worsening epigastric pain radiating to her back and nausea after "a glass of wine", and admitted for acute uncomplicated pancreatitis.  Incidentally tested positive for COVID-19.  She is unvaccinated.  No respiratory symptoms.  Pancreatitis improving.  She tolerated soft diet.  Remained stable from cardiopulmonary standpoint.  Patient was discharged earlier in the morning, but left the hospital before her last dose of remdesivir, and discharge instructions.  Per RN, she already had PIV out to the night before.  I requested RN to call patient and go over her discharge instructions although she does not need to come back for remdesivir.  Discharge Diagnoses:  Acute alcoholic pancreatitis without infection, necrosis or pseudocyst-reportedly sober for about 2 months, and had "a glass of wine".  Tolerated soft diet.  Abdominal pain resolved. Recent Labs  Lab 02/25/21 0123 02/26/21 0335 02/27/21 0410  LIPASE 787* 182* 191*  -Encouraged to quit drinking alcohol -Patient to continue soft diet and avoid greasy food for the next 3 to 4 days   COVID-19 infection-incidental finding.  Not symptomatic from this. Received 2 doses of remdesivir and left before the third dose.  Encouraged to get her COVID  vaccination after 2 weeks.  Counseled on isolation precautions per CDC guideline.   Mildly elevated AST/ALT: Likely due to alcohol or COVID-19.  CK within normal.  Resolved.   Alcohol use disorder: Reportedly sober for 2 months and had "a glass of wine".  No withdrawal symptoms. -Thiamine, folic acid and multivitamin -Cessation counseling   Leukocytosis/bandemia: Likely demargination.  Improved without antibiotics. -Recheck in the morning   Sinus tachycardia: Likely from pain.  Could be alcohol withdrawal.  Resolved.  Euthyroid sick syndrome: -Consider repeating TSH in 4 to 6 weeks  Elevated D-dimer: Likely due to COVID.  No signs of VTE.   Anxiety and depression: Not on medications.  Not an imminent danger to self or others. -Encouraged to establish care with PCP   Bacteriuria: UA with many bacteria.  Denies UTI symptoms. -No need for treatment   Possible cirrhosis-likely from alcohol. -Discussed finding with patient and advised to stop alcohol -Encourage lifestyle change to lose weight   Macrocytosis: Likely from alcohol. -Multivitamin and folic acid   Body mass index is 26.37 kg/m.            Discharge Exam: Vitals:   02/26/21 2116 02/27/21 0603  BP: 121/86 122/82  Pulse: 92 79  Resp: 18 18  Temp: 98.5 F (36.9 C) 98.4 F (36.9 C)  SpO2: 100% 98%    GENERAL: No apparent distress.  Nontoxic. HEENT: MMM.  Vision and hearing grossly intact.  NECK: Supple.  No apparent JVD.  RESP:  No IWOB.  Fair aeration bilaterally. CVS:  RRR. Heart sounds normal.  ABD/GI/GU: Bowel sounds present. Soft. Non tender.  MSK/EXT:  Moves extremities. No apparent deformity. No edema.  SKIN: no apparent  skin lesion or wound NEURO: Awake, alert and oriented appropriately.  No apparent focal neuro deficit. PSYCH: Calm. Normal affect.   Discharge Instructions  Discharge Instructions     Call MD for:  difficulty breathing, headache or visual disturbances   Complete by: As  directed    Call MD for:  extreme fatigue   Complete by: As directed    Call MD for:  persistant dizziness or light-headedness   Complete by: As directed    Call MD for:  persistant nausea and vomiting   Complete by: As directed    Call MD for:  severe uncontrolled pain   Complete by: As directed    Call MD for:  temperature >100.4   Complete by: As directed    Diet general   Complete by: As directed    You may continue soft diet for the next 3 to 4 days, and resume regular diet after that.  You may back off to full liquid diet if you have pain soft diet and slowly advance as tolerated.   Discharge instructions   Complete by: As directed    It has been a pleasure taking care of you!  You were hospitalized due to acute pancreatitis likely from alcohol.  It is very important that you quit drinking alcohol.  You also tested positive for COVID-19.  Fortunately, you were not symptomatic from this.  Since you are not vaccinated, which we continue with antiviral medication to reduce your risk of severe illness.  It is very important that you get your COVID vaccinations.  You can safely do this 2 weeks from now.  You are potentially contagious for the next 8 days.  We strongly recommend precautions to reduce risk of spreading the virus to others.  Recommend everyone in the house to use a facemask and use appropriate hand sanitizer with at least 65% alcohol.    Take care,   Increase activity slowly   Complete by: As directed       Allergies as of 02/27/2021       Reactions   Compazine [prochlorperazine] Anxiety   Phenergan [promethazine Hcl] Anxiety        Medication List     TAKE these medications    acetaminophen 500 MG tablet Commonly known as: TYLENOL Take 500-1,000 mg by mouth every 6 (six) hours as needed for mild pain.   folic acid 1 MG tablet Commonly known as: FOLVITE Take 1 tablet (1 mg total) by mouth daily.   multivitamin with minerals Tabs tablet Take 1 tablet  by mouth daily.   thiamine 100 MG tablet Take 1 tablet (100 mg total) by mouth daily.        Consultations: None  Procedures/Studies:   CT ABDOMEN PELVIS W CONTRAST  Result Date: 02/25/2021 CLINICAL DATA:  Pancreatitis FLAIR for 2 days EXAM: CT ABDOMEN AND PELVIS WITH CONTRAST TECHNIQUE: Multidetector CT imaging of the abdomen and pelvis was performed using the standard protocol following bolus administration of intravenous contrast. CONTRAST:  28mL OMNIPAQUE IOHEXOL 350 MG/ML SOLN COMPARISON:  01/14/2021 FINDINGS: Lower chest:  Mild atelectasis at the bases Hepatobiliary: Similar pattern of patchy dense fatty infiltration of the liver with generalized lobulation of the liver surface. Caudate lobe is enlarged and compresses the infra hepatic cava.No evidence of biliary obstruction or stone. Pancreas: Mild fat stranding along the pancreatic head and body without necrosis or collection. Spleen: Unremarkable. Adrenals/Urinary Tract: Negative adrenals. No hydronephrosis or stone. Unremarkable bladder. Stomach/Bowel:  No obstruction. No appendicitis.  Vascular/Lymphatic: No acute vascular abnormality. No mass or adenopathy. Reproductive:No pathologic findings.  Probable tubal ligation. Other: No ascites or pneumoperitoneum. Musculoskeletal: No acute abnormalities. IMPRESSION: 1. Acute edematous pancreatitis without necrosis or collection. 2. Heterogeneous fatty infiltration of the liver which shows lobulated surface-possible cirrhosis. Electronically Signed   By: Marnee Spring M.D.   On: 02/25/2021 05:37   DG CHEST PORT 1 VIEW  Result Date: 02/25/2021 CLINICAL DATA:  40 year old female with history of weakness shortness of breath. COVID infection. EXAM: PORTABLE CHEST 1 VIEW COMPARISON:  No priors. FINDINGS: Patchy ill-defined opacities and areas of subtle interstitial prominence are noted in the lower lungs bilaterally. Linear opacity in the left mid lung. No pleural effusions. No pneumothorax. No  evidence of pulmonary edema. No definite suspicious appearing pulmonary nodules or masses are noted. Heart size is normal. Upper mediastinal contours are within normal limits. IMPRESSION: 1. Very subtle findings in the lower lungs which could be indicative of developing multilobar bilateral pneumonia as can be seen in the setting of COVID infection. Electronically Signed   By: Trudie Reed M.D.   On: 02/25/2021 04:58       The results of significant diagnostics from this hospitalization (including imaging, microbiology, ancillary and laboratory) are listed below for reference.     Microbiology: Recent Results (from the past 240 hour(s))  Resp Panel by RT-PCR (Flu A&B, Covid) Nasopharyngeal Swab     Status: Abnormal   Collection Time: 02/25/21  3:08 AM   Specimen: Nasopharyngeal Swab; Nasopharyngeal(NP) swabs in vial transport medium  Result Value Ref Range Status   SARS Coronavirus 2 by RT PCR POSITIVE (A) NEGATIVE Final    Comment: RESULT CALLED TO, READ BACK BY AND VERIFIED WITH: KERSCHERI,Q RN @0425  ON 02/25/21 JAKSON,K (NOTE) SARS-CoV-2 target nucleic acids are DETECTED.  The SARS-CoV-2 RNA is generally detectable in upper respiratory specimens during the acute phase of infection. Positive results are indicative of the presence of the identified virus, but do not rule out bacterial infection or co-infection with other pathogens not detected by the test. Clinical correlation with patient history and other diagnostic information is necessary to determine patient infection status. The expected result is Negative.  Fact Sheet for Patients: 04/27/21  Fact Sheet for Healthcare Providers: BloggerCourse.com  This test is not yet approved or cleared by the [employer] FDA and  has been authorized for detection and/or diagnosis of SARS-CoV-2 by FDA under an Emergency Use Authorization (EUA).  This EUA will remain in effect  (meaning this test  can be used) for the duration of  the COVID-19 declaration under Section 564(b)(1) of the Act, 21 U.S.C. section 360bbb-3(b)(1), unless the authorization is terminated or revoked sooner.     Influenza A by PCR NEGATIVE NEGATIVE Final   Influenza B by PCR NEGATIVE NEGATIVE Final    Comment: (NOTE) The Xpert Xpress SARS-CoV-2/FLU/RSV plus assay is intended as an aid in the diagnosis of influenza from Nasopharyngeal swab specimens and should not be used as a sole basis for treatment. Nasal washings and aspirates are unacceptable for Xpert Xpress SARS-CoV-2/FLU/RSV testing.  Fact Sheet for Patients: [place]  Fact Sheet for Healthcare Providers: BloggerCourse.com  This test is not yet approved or cleared by the [employer] FDA and has been authorized for detection and/or diagnosis of SARS-CoV-2 by FDA under an Emergency Use Authorization (EUA). This EUA will remain in effect (meaning this test can be used) for the duration of the COVID-19 declaration under Section 564(b)(1) of the Act, 21 U.S.C.  section 360bbb-3(b)(1), unless the authorization is terminated or revoked.  Performed at [facility], [address], [place], [place] [address]      Labs:  CBC: Recent Labs  Lab 02/25/21 0123 02/25/21 0510 02/26/21 0335  WBC 12.4* 11.9* 10.6*  NEUTROABS 8.7* 8.1* 6.8  HGB 15.2* 14.2 13.5  HCT 44.8 42.5 41.4  MCV 100.4* 100.5* 104.3*  PLT 421* 399 371   BMP &GFR Recent Labs  Lab 02/25/21 0123 02/25/21 0510 02/26/21 0335 02/27/21 0410  NA 137  --  137 137  K 3.5  --  3.6 3.5  CL 100  --  103 100  CO2 25  --  30 28  GLUCOSE 98  --  77 82  BUN 6  --  <5* <5*  CREATININE 0.49 0.32* 0.31* 0.39*  CALCIUM 9.3  --  8.7* 8.6*  MG  --   --  2.0 2.0  PHOS  --   --  2.8 3.6   Estimated Creatinine Clearance: 95.3 mL/min (A) (by C-G formula based on SCr of 0.39 mg/dL (L)). Liver  & Pancreas: Recent Labs  Lab 02/25/21 0123 02/26/21 0335 02/27/21 0410  AST 58* 35 90*  ALT 32 23 33  ALKPHOS 237* 169* 206*  BILITOT 1.2 1.0 0.9  PROT 8.5* 7.0 6.5  ALBUMIN 4.0 3.4* 3.0*   Recent Labs  Lab 02/25/21 0123 02/26/21 0335 02/27/21 0410  LIPASE 787* 182* 191*   No results for input(s): AMMONIA in the last 168 hours. Diabetic: No results for input(s): HGBA1C in the last 72 hours. Recent Labs  Lab 02/25/21 1644 02/25/21 2135 02/26/21 0751 02/26/21 1544 02/27/21 0000  GLUCAP 90 87 78 82 92   Cardiac Enzymes: Recent Labs  Lab 02/26/21 0335  CKTOTAL 37*   No results for input(s): PROBNP in the last 8760 hours. Coagulation Profile: No results for input(s): INR, PROTIME in the last 168 hours. Thyroid Function Tests: Recent Labs    02/27/21 0410  TSH 5.464*  FREET4 1.11   Lipid Profile: No results for input(s): CHOL, HDL, LDLCALC, TRIG, CHOLHDL, LDLDIRECT in the last 72 hours. Anemia Panel: No results for input(s): VITAMINB12, FOLATE, FERRITIN, TIBC, IRON, RETICCTPCT in the last 72 hours. Urine analysis:    Component Value Date/Time   COLORURINE YELLOW 02/25/2021 0123   APPEARANCEUR CLEAR 02/25/2021 0123   LABSPEC 1.009 02/25/2021 0123   PHURINE 6.0 02/25/2021 0123   GLUCOSEU NEGATIVE 02/25/2021 0123   HGBUR NEGATIVE 02/25/2021 0123   BILIRUBINUR NEGATIVE 02/25/2021 0123   KETONESUR 20 (A) 02/25/2021 0123   PROTEINUR NEGATIVE 02/25/2021 0123   UROBILINOGEN 1.0 02/08/2013 0422   NITRITE POSITIVE (A) 02/25/2021 0123   LEUKOCYTESUR NEGATIVE 02/25/2021 0123   Sepsis Labs: Invalid input(s): PROCALCITONIN, LACTICIDVEN   Time coordinating discharge: 35 minutes  SIGNED:  Almon Hercules, MD  [facility] 02/27/2021, 6:04 PM  If 7PM-7AM, please contact night-coverage [email or web address]

## 2021-02-27 NOTE — Progress Notes (Signed)
Pt does not desire to wear tele.

## 2021-11-20 ENCOUNTER — Other Ambulatory Visit: Payer: Self-pay

## 2021-11-20 ENCOUNTER — Emergency Department (HOSPITAL_COMMUNITY)
Admission: EM | Admit: 2021-11-20 | Discharge: 2021-11-20 | Disposition: A | Discharge status: Home / Self Care | Payer: Medicaid Other | Attending: Emergency Medicine | Admitting: Emergency Medicine

## 2021-11-20 ENCOUNTER — Emergency Department (HOSPITAL_COMMUNITY): Payer: Medicaid Other

## 2021-11-20 DIAGNOSIS — D72829 Elevated white blood cell count, unspecified: Secondary | ICD-10-CM | POA: Insufficient documentation

## 2021-11-20 DIAGNOSIS — N9489 Other specified conditions associated with female genital organs and menstrual cycle: Secondary | ICD-10-CM | POA: Insufficient documentation

## 2021-11-20 DIAGNOSIS — K852 Alcohol induced acute pancreatitis without necrosis or infection: Secondary | ICD-10-CM | POA: Insufficient documentation

## 2021-11-20 DIAGNOSIS — R1013 Epigastric pain: Secondary | ICD-10-CM | POA: Diagnosis present

## 2021-11-20 LAB — COMPREHENSIVE METABOLIC PANEL
ALT: 19 U/L (ref 0–44)
AST: 33 U/L (ref 15–41)
Albumin: 4.1 g/dL (ref 3.5–5.0)
Alkaline Phosphatase: 157 U/L — ABNORMAL HIGH (ref 38–126)
Anion gap: 10 (ref 5–15)
BUN: 9 mg/dL (ref 6–20)
CO2: 20 mmol/L — ABNORMAL LOW (ref 22–32)
Calcium: 9.2 mg/dL (ref 8.9–10.3)
Chloride: 104 mmol/L (ref 98–111)
Creatinine, Ser: 0.48 mg/dL (ref 0.44–1.00)
GFR, Estimated: >60 mL/min (ref >60)
Glucose, Bld: 104 mg/dL — ABNORMAL HIGH (ref 70–99)
Potassium: 3.2 mmol/L — ABNORMAL LOW (ref 3.5–5.1)
Sodium: 134 mmol/L — ABNORMAL LOW (ref 135–145)
Total Bilirubin: 1.1 mg/dL (ref 0.3–1.2)
Total Protein: 8.6 g/dL — ABNORMAL HIGH (ref 6.5–8.1)

## 2021-11-20 LAB — CBC WITH DIFFERENTIAL/PLATELET
Abs Immature Granulocytes: 0.03 10*3/uL (ref 0.00–0.07)
Basophils Absolute: 0 10*3/uL (ref 0.0–0.1)
Basophils Relative: 0 %
Eosinophils Absolute: 0.1 10*3/uL (ref 0.0–0.5)
Eosinophils Relative: 1 %
HCT: 45 % (ref 36.0–46.0)
Hemoglobin: 16 g/dL — ABNORMAL HIGH (ref 12.0–15.0)
Immature Granulocytes: 0 %
Lymphocytes Relative: 23 %
Lymphs Abs: 2.5 10*3/uL (ref 0.7–4.0)
MCH: 33.7 pg (ref 26.0–34.0)
MCHC: 35.6 g/dL (ref 30.0–36.0)
MCV: 94.7 fL (ref 80.0–100.0)
Monocytes Absolute: 0.6 10*3/uL (ref 0.1–1.0)
Monocytes Relative: 6 %
Neutro Abs: 7.4 10*3/uL (ref 1.7–7.7)
Neutrophils Relative %: 70 %
Platelets: 308 10*3/uL (ref 150–400)
RBC: 4.75 MIL/uL (ref 3.87–5.11)
RDW: 12.8 % (ref 11.5–15.5)
WBC: 10.7 10*3/uL — ABNORMAL HIGH (ref 4.0–10.5)
nRBC: 0 % (ref 0.0–0.2)

## 2021-11-20 LAB — URINALYSIS, ROUTINE W REFLEX MICROSCOPIC
Bilirubin Urine: NEGATIVE
Glucose, UA: NEGATIVE mg/dL
Hgb urine dipstick: NEGATIVE
Ketones, ur: 80 mg/dL — AB
Leukocytes,Ua: NEGATIVE
Nitrite: NEGATIVE
Protein, ur: NEGATIVE mg/dL
Specific Gravity, Urine: 1.014 (ref 1.005–1.030)
pH: 5 (ref 5.0–8.0)

## 2021-11-20 LAB — LIPASE, BLOOD: Lipase: 334 U/L — ABNORMAL HIGH (ref 11–51)

## 2021-11-20 LAB — I-STAT BETA HCG BLOOD, ED (MC, WL, AP ONLY): I-stat hCG, quantitative: <5 m[IU]/mL (ref <5)

## 2021-11-20 MED ORDER — POTASSIUM CHLORIDE CRYS ER 20 MEQ PO TBCR
40.0000 meq | EXTENDED_RELEASE_TABLET | Freq: Once | ORAL | Status: AC
Start: 2021-11-20 — End: 2021-11-20
  Administered 2021-11-20: 40 meq via ORAL
  Filled 2021-11-20: qty 2

## 2021-11-20 MED ORDER — ONDANSETRON HCL 4 MG/2ML IJ SOLN
4.0000 mg | Freq: Once | INTRAMUSCULAR | Status: AC
Start: 2021-11-20 — End: 2021-11-20
  Administered 2021-11-20: 4 mg via INTRAVENOUS
  Filled 2021-11-20: qty 2

## 2021-11-20 MED ORDER — HYDROMORPHONE HCL 1 MG/ML IJ SOLN
1.0000 mg | Freq: Once | INTRAMUSCULAR | Status: AC
  Administered 2021-11-20: 1 mg via INTRAVENOUS
  Filled 2021-11-20: qty 1

## 2021-11-20 MED ORDER — KETOROLAC TROMETHAMINE 15 MG/ML IJ SOLN
15.0000 mg | Freq: Once | INTRAMUSCULAR | Status: AC
  Administered 2021-11-20: 15 mg via INTRAVENOUS
  Filled 2021-11-20: qty 1

## 2021-11-20 MED ORDER — ONDANSETRON HCL 4 MG PO TABS: 4.0000 mg | ORAL_TABLET | Freq: Four times a day (QID) | ORAL | 0 refills | Status: DC

## 2021-11-20 MED ORDER — SODIUM CHLORIDE 0.9 % IV BOLUS
1000.0000 mL | Freq: Once | INTRAVENOUS | Status: AC
  Administered 2021-11-20: 1000 mL via INTRAVENOUS

## 2021-11-20 MED ORDER — ACETAMINOPHEN 500 MG PO TABS
500.0000 mg | ORAL_TABLET | Freq: Four times a day (QID) | ORAL | 1 refills | Status: DC | PRN
Start: 2021-11-20 — End: 2023-06-26

## 2021-11-20 MED ORDER — IOHEXOL 300 MG/ML  SOLN
80.0000 mL | Freq: Once | INTRAMUSCULAR | Status: AC | PRN
  Administered 2021-11-20: 80 mL via INTRAVENOUS

## 2021-11-20 MED ORDER — HYDROCODONE-ACETAMINOPHEN 5-325 MG PO TABS
2.0000 | ORAL_TABLET | ORAL | 0 refills | Status: DC | PRN
Start: 2021-11-20 — End: 2022-06-23

## 2021-11-20 NOTE — ED Triage Notes (Addendum)
Pt BIBA from home. Pt experiencing 10/10 abd pain in R/L UQ for 2x days. Radiates to back. Pt states pain is similar to pancreatitis they had last september.  ? ?Nauseous- allergic to phenergan.  ? ?BP: 120/90 ?HR: 120 ?SPO2: 100 RA ?CBG: 79 ?

## 2021-11-20 NOTE — Discharge Instructions (Addendum)
I have sent Zofran, Tylenol and hydrocodone to your pharmacy.  The hydrocodone already has Tylenol ?So if you are taking that do not take the Tylenol as well.  Only use the hydrocodone for severe pain otherwise please just take Tylenol. ? ?Do your best to stop drinking alcohol and follow-up with the gastroenterologist about your liver.  Return with any fevers or chills. ?

## 2021-11-20 NOTE — ED Provider Notes (Signed)
?[facility] DEPT ?Provider Note ? ? ?CSN: [id number] ?Arrival date & time: 11/20/21  1055 ? ?  ? ?History ? ?Chief Complaint  ?Patient presents with  ? Abdominal Pain  ? ? ?Marisa Palmer is a 41 y.o. female with a past medical history of pancreatitis presenting today with abdominal pain.  She reports that 2 days ago she started to have epigastric and upper quadrant pain.  She described this as "sharp and cramping."  Says over the past 2 days it is worsened.  She was at a birthday party last weekend and drink a moderate amount of alcohol and believes that this started her symptoms.  Endorses nausea however no vomiting.  Normal bowel movements.  No fevers but has experienced chills.  Last menstrual period 1 week ago, no urinary or vaginal symptoms.  No history of abdominal surgery and reports that she only drinks wine occasionally and her only recent instance of heavy drinking was last weekend. ? ? ?Abdominal Pain ?Associated symptoms: chills and nausea   ?Associated symptoms: no diarrhea, no dysuria, no fever, no hematuria, no vaginal bleeding, no vaginal discharge and no vomiting   ? ?  ? ?Home Medications ?Prior to Admission medications   ?Medication Sig Start Date End Date Taking? Authorizing Provider  ?acetaminophen (TYLENOL) 500 MG tablet Take 500-1,000 mg by mouth every 6 (six) hours as needed for mild pain.    [provider]  ?folic acid (FOLVITE) 1 MG tablet Take 1 tablet (1 mg total) by mouth daily. 02/27/21   Mercy Riding, MD  ?Multiple Vitamin (MULTIVITAMIN WITH MINERALS) TABS tablet Take 1 tablet by mouth daily. 02/27/21   Mercy Riding, MD  ?thiamine 100 MG tablet Take 1 tablet (100 mg total) by mouth daily. 02/27/21   Mercy Riding, MD  ?   ? ?Allergies    ?Compazine [prochlorperazine] and Phenergan [promethazine hcl]   ? ?Review of Systems   ?Review of Systems  ?Constitutional:  Positive for chills. Negative for fever.  ?Gastrointestinal:  Positive for abdominal pain and  nausea. Negative for diarrhea and vomiting.  ?Genitourinary:  Negative for dysuria, hematuria, vaginal bleeding, vaginal discharge and vaginal pain.  ?See HPI ? ?Physical Exam ?Updated Vital Signs ?BP 110/79 (BP Location: Right Arm)   Pulse (!) 102   Temp 98.1 ?F (36.7 ?C) (Oral)   Resp 20   Ht 5\' 5"  (1.651 m)   Wt 65.8 kg   SpO2 100%   BMI 24.13 kg/m?  ?Physical Exam ?Vitals and nursing note reviewed.  ?Constitutional:   ?   General: She is in acute distress (Writhing in pain).  ?   Appearance: Normal appearance. She is not ill-appearing.  ?HENT:  ?   Head: Normocephalic and atraumatic.  ?Eyes:  ?   General: No scleral icterus. ?   Conjunctiva/sclera: Conjunctivae normal.  ?Cardiovascular:  ?   Rate and Rhythm: Regular rhythm. Tachycardia present.  ?Pulmonary:  ?   Effort: Pulmonary effort is normal. No respiratory distress.  ?Abdominal:  ?   General: Abdomen is flat. Bowel sounds are normal.  ?   Palpations: Abdomen is soft. There is no mass.  ?   Tenderness: There is abdominal tenderness in the right upper quadrant, epigastric area and left upper quadrant. There is no guarding. Negative signs include Murphy's sign and McBurney's sign.  ?   Hernia: No hernia is present.  ?Skin: ?   Findings: No rash.  ?Neurological:  ?   Mental Status:  She is alert.  ?Psychiatric:     ?   Mood and Affect: Mood normal.     ?   Behavior: Behavior normal.  ? ? ?ED Results / Procedures / Treatments   ?Labs ?(all labs ordered are listed, but only abnormal results are displayed) ?Labs Reviewed  ?COMPREHENSIVE METABOLIC PANEL - Abnormal; Notable for the following components:  ?    Result Value  ? Sodium 134 (*)   ? Potassium 3.2 (*)   ? CO2 20 (*)   ? Glucose, Bld 104 (*)   ? Total Protein 8.6 (*)   ? Alkaline Phosphatase 157 (*)   ? All other components within normal limits  ?LIPASE, BLOOD - Abnormal; Notable for the following components:  ? Lipase 334 (*)   ? All other components within normal limits  ?CBC WITH  DIFFERENTIAL/PLATELET - Abnormal; Notable for the following components:  ? WBC 10.7 (*)   ? Hemoglobin 16.0 (*)   ? All other components within normal limits  ?URINALYSIS, ROUTINE W REFLEX MICROSCOPIC - Abnormal; Notable for the following components:  ? Ketones, ur 80 (*)   ? All other components within normal limits  ?I-STAT BETA HCG BLOOD, ED ([facility], [facility], [facility] ONLY)  ? ? ?EKG ?EKG Interpretation ? ?Date/Time:  Saturday November 20 2021 11:39:41 EDT ?Ventricular Rate:  80 ?PR Interval:  139 ?QRS Duration: 97 ?QT Interval:  412 ?QTC Calculation: 476 ?R Axis:   60 ?Text Interpretation: Sinus rhythm Probable left atrial enlargement Confirmed by Regan Lemming ([id number]) on 11/20/2021 12:04:55 PM ? ?Radiology ?CT ABDOMEN PELVIS W CONTRAST ? ?Result Date: 11/20/2021 ?CLINICAL DATA:  Abdominal pain, acute, nonlocalized. Elevated serum lipase EXAM: CT ABDOMEN AND PELVIS WITH CONTRAST TECHNIQUE: Multidetector CT imaging of the abdomen and pelvis was performed using the standard protocol following bolus administration of intravenous contrast. RADIATION DOSE REDUCTION: This exam was performed according to the departmental dose-optimization program which includes automated exposure control, adjustment of the mA and/or kV according to patient size and/or use of iterative reconstruction technique. CONTRAST:  76mL OMNIPAQUE IOHEXOL 300 MG/ML  SOLN COMPARISON:  03/01/2021 FINDINGS: Lower chest: Included lung bases are clear.  Heart size is normal. Hepatobiliary: Similar appearance of the liver with geographic areas of decreased attenuation, caudate lobe hypertrophy, and lobulated surface contour. No well-defined hepatic mass. Unremarkable gallbladder. No hyperdense gallstone. No biliary dilatation. Pancreas: Mild peripancreatic fat stranding and trace fluid adjacent to the pancreatic body and tail. No evidence of parenchymal necrosis. No walled-off fluid collection. No ductal dilatation. Spleen: Normal in size without focal abnormality.  Adrenals/Urinary Tract: Unremarkable adrenal glands. Kidneys enhance symmetrically without focal lesion, stone, or hydronephrosis. Ureters are nondilated. Urinary bladder appears unremarkable. Stomach/Bowel: Stomach is within normal limits. Appendix appears normal. No evidence of bowel Baird thickening, distention, or inflammatory changes. Vascular/Lymphatic: Scattered aortoiliac atherosclerotic calcifications without aneurysm. No abdominopelvic lymphadenopathy. Reproductive: Unremarkable uterus. Tubal ligation clip on the left. Bilateral adnexal regions are within normal limits. Other: No organized abdominopelvic fluid collection. No pneumoperitoneum. No abdominal Mentzel hernia. Musculoskeletal: Degenerative disc disease most pronounced at L2-3, L4-5, and L5-S1. No new or acute bony findings. IMPRESSION: 1. Acute uncomplicated pancreatitis. 2. Unchanged appearance of the liver which may be related to cirrhotic change secondary to the patient's history of cystic fibrosis. A nonemergent contrast enhanced MRI is recommended when the patient's clinical condition improves to allow for better breath hold and optimum imaging for better delineation of the overall disease process. Aortic Atherosclerosis (ICD10-I70.0). Electronically Signed   By: Hart Carwin  Plundo D.O.   On: 11/20/2021 13:21   ? ?Procedures ?Procedures  ? ?Medications Ordered in ED ?Medications  ?ondansetron (ZOFRAN) injection 4 mg (4 mg Intravenous Given 11/20/21 1146)  ?HYDROmorphone (DILAUDID) injection 1 mg (1 mg Intravenous Given 11/20/21 1146)  ?sodium chloride 0.9 % bolus 1,000 mL (0 mLs Intravenous Stopped 11/20/21 1256)  ?ketorolac (TORADOL) 15 MG/ML injection 15 mg (15 mg Intravenous Given 11/20/21 1233)  ?iohexol (OMNIPAQUE) 300 MG/ML solution 80 mL (80 mLs Intravenous Contrast Given 11/20/21 1243)  ?sodium chloride 0.9 % bolus 1,000 mL (1,000 mLs Intravenous New Bag/Given 11/20/21 1404)  ?HYDROmorphone (DILAUDID) injection 1 mg (1 mg Intravenous Given  11/20/21 1403)  ?potassium chloride SA (KLOR-CON M) CR tablet 40 mEq (40 mEq Oral Given 11/20/21 1403)  ? ? ?ED Course/ Medical Decision Making/ A&P ?Clinical Course as of 11/20/21 1445  ?Sat Nov 20, 2021  ?1225 Patient reassessed, rates

## 2022-06-21 ENCOUNTER — Other Ambulatory Visit: Payer: Self-pay

## 2022-06-21 ENCOUNTER — Inpatient Hospital Stay (HOSPITAL_COMMUNITY)
Admission: EM | Admit: 2022-06-21 | Discharge: 2022-06-23 | DRG: 440 | Disposition: A | Discharge status: Home / Self Care | Payer: Medicaid Other | Attending: Internal Medicine | Admitting: Internal Medicine

## 2022-06-21 ENCOUNTER — Emergency Department (HOSPITAL_COMMUNITY): Payer: Medicaid Other

## 2022-06-21 DIAGNOSIS — F411 Generalized anxiety disorder: Secondary | ICD-10-CM | POA: Diagnosis not present

## 2022-06-21 DIAGNOSIS — Z833 Family history of diabetes mellitus: Secondary | ICD-10-CM

## 2022-06-21 DIAGNOSIS — G8929 Other chronic pain: Secondary | ICD-10-CM | POA: Diagnosis present

## 2022-06-21 DIAGNOSIS — K852 Alcohol induced acute pancreatitis without necrosis or infection: Principal | ICD-10-CM | POA: Diagnosis present

## 2022-06-21 DIAGNOSIS — Z8249 Family history of ischemic heart disease and other diseases of the circulatory system: Secondary | ICD-10-CM

## 2022-06-21 DIAGNOSIS — Z79899 Other long term (current) drug therapy: Secondary | ICD-10-CM

## 2022-06-21 DIAGNOSIS — M549 Dorsalgia, unspecified: Secondary | ICD-10-CM | POA: Diagnosis present

## 2022-06-21 DIAGNOSIS — Z780 Asymptomatic menopausal state: Secondary | ICD-10-CM

## 2022-06-21 DIAGNOSIS — E860 Dehydration: Secondary | ICD-10-CM | POA: Diagnosis present

## 2022-06-21 DIAGNOSIS — Z87891 Personal history of nicotine dependence: Secondary | ICD-10-CM

## 2022-06-21 DIAGNOSIS — Z888 Allergy status to other drugs, medicaments and biological substances status: Secondary | ICD-10-CM

## 2022-06-21 LAB — CBC
HCT: 47.1 % — ABNORMAL HIGH (ref 36.0–46.0)
Hemoglobin: 16.2 g/dL — ABNORMAL HIGH (ref 12.0–15.0)
MCH: 33.8 pg (ref 26.0–34.0)
MCHC: 34.4 g/dL (ref 30.0–36.0)
MCV: 98.3 fL (ref 80.0–100.0)
Platelets: 381 10*3/uL (ref 150–400)
RBC: 4.79 MIL/uL (ref 3.87–5.11)
RDW: 12.9 % (ref 11.5–15.5)
WBC: 11.5 10*3/uL — ABNORMAL HIGH (ref 4.0–10.5)
nRBC: 0 % (ref 0.0–0.2)

## 2022-06-21 LAB — COMPREHENSIVE METABOLIC PANEL
ALT: 37 U/L (ref 0–44)
AST: 51 U/L — ABNORMAL HIGH (ref 15–41)
Albumin: 4 g/dL (ref 3.5–5.0)
Alkaline Phosphatase: 202 U/L — ABNORMAL HIGH (ref 38–126)
Anion gap: 11 (ref 5–15)
BUN: 10 mg/dL (ref 6–20)
CO2: 23 mmol/L (ref 22–32)
Calcium: 9.3 mg/dL (ref 8.9–10.3)
Chloride: 102 mmol/L (ref 98–111)
Creatinine, Ser: 0.44 mg/dL (ref 0.44–1.00)
GFR, Estimated: >60 mL/min (ref >60)
Glucose, Bld: 111 mg/dL — ABNORMAL HIGH (ref 70–99)
Potassium: 3.5 mmol/L (ref 3.5–5.1)
Sodium: 136 mmol/L (ref 135–145)
Total Bilirubin: 1.1 mg/dL (ref 0.3–1.2)
Total Protein: 8.8 g/dL — ABNORMAL HIGH (ref 6.5–8.1)

## 2022-06-21 LAB — I-STAT BETA HCG BLOOD, ED (MC, WL, AP ONLY): I-stat hCG, quantitative: <5 m[IU]/mL (ref <5)

## 2022-06-21 LAB — LIPASE, BLOOD: Lipase: 651 U/L — ABNORMAL HIGH (ref 11–51)

## 2022-06-21 MED ORDER — KETOROLAC TROMETHAMINE 15 MG/ML IJ SOLN
15.0000 mg | Freq: Four times a day (QID) | INTRAMUSCULAR | Status: AC
  Administered 2022-06-22 – 2022-06-23 (×5): 15 mg via INTRAVENOUS
  Filled 2022-06-21 (×5): qty 1

## 2022-06-21 MED ORDER — ONDANSETRON HCL 4 MG/2ML IJ SOLN
4.0000 mg | Freq: Once | INTRAMUSCULAR | Status: AC
  Administered 2022-06-22: 4 mg via INTRAVENOUS
  Filled 2022-06-21: qty 2

## 2022-06-21 MED ORDER — LACTATED RINGERS IV SOLN: INTRAVENOUS | Status: AC

## 2022-06-21 MED ORDER — IOHEXOL 300 MG/ML  SOLN
80.0000 mL | Freq: Once | INTRAMUSCULAR | Status: AC | PRN
Start: 2022-06-21 — End: 2022-06-21
  Administered 2022-06-21: 80 mL via INTRAVENOUS

## 2022-06-21 MED ORDER — HYDROMORPHONE HCL 1 MG/ML IJ SOLN
1.0000 mg | Freq: Once | INTRAMUSCULAR | Status: AC
  Administered 2022-06-21: 1 mg via INTRAVENOUS
  Filled 2022-06-21: qty 1

## 2022-06-21 MED ORDER — PANTOPRAZOLE SODIUM 40 MG IV SOLR
40.0000 mg | Freq: Once | INTRAVENOUS | Status: AC
  Administered 2022-06-22: 40 mg via INTRAVENOUS
  Filled 2022-06-21: qty 10

## 2022-06-21 MED ORDER — PAROXETINE HCL 10 MG PO TABS
20.0000 mg | ORAL_TABLET | Freq: Every day | ORAL | Status: DC
  Administered 2022-06-22 – 2022-06-23 (×2): 20 mg via ORAL
  Filled 2022-06-21 (×2): qty 2

## 2022-06-21 MED ORDER — ONDANSETRON HCL 4 MG/2ML IJ SOLN
INTRAMUSCULAR | Status: AC
  Administered 2022-06-21: 4 mg via INTRAVENOUS
  Filled 2022-06-21: qty 2

## 2022-06-21 MED ORDER — ONDANSETRON HCL 4 MG/2ML IJ SOLN: 4.0000 mg | Freq: Once | INTRAMUSCULAR | Status: AC

## 2022-06-21 MED ORDER — SODIUM CHLORIDE 0.9 % IV BOLUS
1000.0000 mL | Freq: Once | INTRAVENOUS | Status: AC
  Administered 2022-06-21: 1000 mL via INTRAVENOUS

## 2022-06-21 NOTE — Subjective & Objective (Signed)
CC: abd pain, drank etoh this weekend HPI: 41 year old Caucasian female history of alcoholic pancreatitis, anxiety disorder, prior history of opiate abuse in the past presents to the ER today with abdominal pain over the weekend.  She states that she had about 4 glasses of wine on Saturday.  Started having abdominal pain on Sunday.  She is continue to have abdominal pain for the last 3 days.  Increasing pain radiating to her back.  Nauseous.  Vomiting.  Came to ER for evaluation.  Arrival temp 98.4 heart rate 111 blood pressure 143/115.  Labs: Lipase 651 BUN of 10, creatinine 0.4  AST 51, ALT 37, alk phos 202 total bili 1.1  White count 11.5, hemosixteen 0.2, platelets of 381  CT abdomen pelvis demonstrates peripancreatic edema and stranding compatible with pancreatitis.  No definitive area of necrosis.  Liver appears cirrhotic with nodular contours.  Patient given multiple dose of a pain medicine.  Pain still unresolved.  [facility] contacted for admission.

## 2022-06-21 NOTE — ED Triage Notes (Signed)
Pt BIB EMS from home c/o abdominal pain, nausea and vomiting since yesterday. Hx Pancreatitis.

## 2022-06-21 NOTE — Assessment & Plan Note (Signed)
Continue with paxil 20 mg daily.

## 2022-06-21 NOTE — ED Provider Triage Note (Signed)
Emergency Medicine Provider Triage Evaluation Note  Marisa Palmer , a 41 y.o. female  was evaluated in triage.  Pt complains of epigastric abdominal pain.  Nausea and vomiting seems that her symptoms started yesterday progressively worsened.  History of alcohol induced pancreatitis she says she has not drank in months.  Denies any fevers any urinary frequency urgency dysuria hematuria.  Review of Systems  Positive: Abdominal pain  Negative: fever  Physical Exam  BP (!) 143/115   Pulse (!) 111   Temp 98.4 F (36.9 C) (Oral)   Resp 18   Ht 5\' 5"  (1.651 m)   Wt 65.8 kg   LMP  (LMP Unknown)   SpO2 99%   BMI 24.14 kg/m  Gen:   Awake, uncomfortable Resp:  Normal effort  MSK:   Moves extremities without difficulty  Other:  Epigastric TTP  Medical Decision Making  Medically screening exam initiated at 2:58 PM.  Appropriate orders placed.  Marisa Palmer was informed that the remainder of the evaluation will be completed by another provider, this initial triage assessment does not replace that evaluation, and the importance of remaining in the ED until their evaluation is complete.  CT, labs  IV IS IN PLACE   Marisa Palmer, Marisa Palmer 06/21/22 1459

## 2022-06-21 NOTE — ED Provider Notes (Signed)
[facility] DEPT Provider Note   CSN: [id number] Arrival date & time: 06/21/22  1220     History  Chief Complaint  Patient presents with   Abdominal Pain    Marisa Palmer is a 41 y.o. female.   Abdominal Pain Patient presents with upper abdominal pain.  Has had since yesterday.  History of pancreatitis and states this feels the same.  States she drank 2 glasses of wine on Thanksgiving and thinks that may have been the cause.  No fevers.  Has had nausea and vomiting that is typical to prior episodes.  No diarrhea.  No fevers.    Past Medical History:  Diagnosis Date   Back pain, chronic    Coccygeal fracture (HCC)    Cystic fibrosis    DDD (degenerative disc disease)    Ovarian cyst    Substance abuse ([place])    Thoracic compression fracture (HCC)     Home Medications Prior to Admission medications   Medication Sig Start Date End Date Taking? Authorizing Provider  acetaminophen (TYLENOL) 500 MG tablet Take 1 tablet (500 mg total) by mouth every 6 (six) hours as needed for mild pain. 11/20/21   Redwine, Madison A, PA-C  folic acid (FOLVITE) 1 MG tablet Take 1 tablet (1 mg total) by mouth daily. 02/27/21   Mercy Riding, MD  HYDROcodone-acetaminophen (NORCO/VICODIN) 5-325 MG tablet Take 2 tablets by mouth every 4 (four) hours as needed. 11/20/21   Redwine, Madison A, PA-C  Multiple Vitamin (MULTIVITAMIN WITH MINERALS) TABS tablet Take 1 tablet by mouth daily. 02/27/21   Mercy Riding, MD  ondansetron (ZOFRAN) 4 MG tablet Take 1 tablet (4 mg total) by mouth every 6 (six) hours. 11/20/21   Redwine, Madison A, PA-C  thiamine 100 MG tablet Take 1 tablet (100 mg total) by mouth daily. 02/27/21   Mercy Riding, MD      Allergies    Compazine [prochlorperazine] and Phenergan [promethazine hcl]    Review of Systems   Review of Systems  Gastrointestinal:  Positive for abdominal pain.    Physical Exam Updated Vital Signs BP (!) 155/94 (BP Location: Left Arm)    Pulse (!) 102   Temp 98.7 F (37.1 C) (Oral)   Resp 17   Ht 5\' 5"  (1.651 m)   Wt 65.8 kg   LMP 05/31/2022 Comment: perimenopausal  SpO2 100%   BMI 24.14 kg/m  Physical Exam Vitals and nursing note reviewed.  Cardiovascular:     Rate and Rhythm: Normal rate.  Pulmonary:     Breath sounds: Normal breath sounds.  Abdominal:     Hernia: No hernia is present.     Comments: Moderate epigastric quadrant tenderness.  No hernias palpated.  Skin:    General: Skin is warm.  Neurological:     Mental Status: She is alert.     ED Results / Procedures / Treatments   Labs (all labs ordered are listed, but only abnormal results are displayed) Labs Reviewed  LIPASE, BLOOD - Abnormal; Notable for the following components:      Result Value   Lipase 651 (*)    All other components within normal limits  COMPREHENSIVE METABOLIC PANEL - Abnormal; Notable for the following components:   Glucose, Bld 111 (*)    Total Protein 8.8 (*)    AST 51 (*)    Alkaline Phosphatase 202 (*)    All other components within normal limits  CBC - Abnormal; Notable for the  following components:   WBC 11.5 (*)    Hemoglobin 16.2 (*)    HCT 47.1 (*)    All other components within normal limits  URINALYSIS, ROUTINE W REFLEX MICROSCOPIC  I-STAT BETA HCG BLOOD, ED ([facility], [facility], [facility] ONLY)    EKG None  Radiology CT ABDOMEN PELVIS W CONTRAST  Result Date: 06/21/2022 CLINICAL DATA:  Epigastric pain likely pancreatitis EXAM: CT ABDOMEN AND PELVIS WITH CONTRAST TECHNIQUE: Multidetector CT imaging of the abdomen and pelvis was performed using the standard protocol following bolus administration of intravenous contrast. RADIATION DOSE REDUCTION: This exam was performed according to the departmental dose-optimization program which includes automated exposure control, adjustment of the mA and/or kV according to patient size and/or use of iterative reconstruction technique. CONTRAST:  26mL OMNIPAQUE IOHEXOL 300 MG/ML  SOLN  COMPARISON:  CT 11/20/2021 FINDINGS: Lower chest: No acute abnormality. Hepatobiliary: Nodular contour of the liver compatible with cirrhosis. Similar appearance of the liver with geographic areas of heterogenous attenuation. No definite hepatic mass. Hepatic steatosis. Gallbladder is unremarkable. No biliary dilation. Pancreas: Peripancreatic edema and stranding compatible with pancreatitis, increased from 11/20/2021. No definite areas of parenchymal necrosis. Heterogenous enhancement in the pancreatic head appears unchanged from 11/20/2021. Spleen: Normal in size without focal abnormality. Adrenals/Urinary Tract: Adrenal glands are unremarkable. Kidneys are normal, without renal calculi, focal lesion, or hydronephrosis. Bladder is unremarkable. Stomach/Bowel: Stomach is within normal limits. Appendix appears normal. No evidence of bowel Commons thickening, distention, or inflammatory changes. Vascular/Lymphatic: Patent portal vein. Aortic atherosclerosis. No enlarged abdominal or pelvic lymph nodes. Reproductive: Uterus and bilateral adnexa are unremarkable. Tubal ligation clip on the left. Other: No free intraperitoneal air. Musculoskeletal: No acute or significant osseous findings. IMPRESSION: Acute uncomplicated pancreatitis. Acute peripancreatic fluid is slightly increased compared with 11/20/2021. Cirrhosis. As recommended on CT abdomen and pelvis 11/20/2021 a nonemergent contrast-enhanced MRI is recommended when the patient's clinical condition allows if not performed previously. Aortic Atherosclerosis (ICD10-I70.0). Electronically Signed   By: Minerva Fester M.D.   On: 06/21/2022 17:24    Procedures Procedures    Medications Ordered in ED Medications  sodium chloride 0.9 % bolus 1,000 mL (has no administration in time range)  HYDROmorphone (DILAUDID) injection 1 mg (has no administration in time range)  ondansetron (ZOFRAN) injection 4 mg (4 mg Intravenous Given 06/21/22 1551)  iohexol (OMNIPAQUE)  300 MG/ML solution 80 mL (80 mLs Intravenous Contrast Given 06/21/22 1704)    ED Course/ Medical Decision Making/ A&P                           Medical Decision Making Risk Prescription drug management.   Patient abdominal pain.  Differential diagnoses long but most likely off initial exam and story is pancreatitis.  History of pancreatitis and recently drank alcohol.  This is supported by the elevated lipase along with CT scan that was also independently interpreted showing pancreatitis.  Hemoglobin is elevated showing of dehydration.  In the past has not tolerated outpatient management of this well.  I think she would require admission to the hospital at this time.  White count is mildly elevated.  We will give fluid bolus here along with pain medicines and has had IV antiemetics.  Will discuss with hospitalist for admission.        Final Clinical Impression(s) / ED Diagnoses Final diagnoses:  Alcohol-induced acute pancreatitis, unspecified complication status    Rx / DC Orders ED Discharge Orders     None  Davonna Belling, MD 06/21/22 2233

## 2022-06-21 NOTE — H&P (Signed)
History and Physical    Marisa Palmer BHA:[id number] DOB: 1980/10/11 DOA: 06/21/2022  DOS: the patient was seen and examined on 06/21/2022  PCP: Patient, No Pcp Per   Patient coming from: Home  I have personally briefly reviewed patient's old medical records in [place]  CC: abd pain, drank etoh this weekend HPI: 40 year old Caucasian female history of alcoholic pancreatitis, anxiety disorder, prior history of opiate abuse in the past presents to the ER today with abdominal pain over the weekend.  She states that she had about 4 glasses of wine on Saturday.  Started having abdominal pain on Sunday.  She is continue to have abdominal pain for the last 3 days.  Increasing pain radiating to her back.  Nauseous.  Vomiting.  Came to ER for evaluation.  Arrival temp 98.4 heart rate 111 blood pressure 143/115.  Labs: Lipase 651 BUN of 10, creatinine 0.4  AST 51, ALT 37, alk phos 202 total bili 1.1  White count 11.5, hemosixteen 0.2, platelets of 381  CT abdomen pelvis demonstrates peripancreatic edema and stranding compatible with pancreatitis.  No definitive area of necrosis.  Liver appears cirrhotic with nodular contours.  Patient given multiple dose of a pain medicine.  Pain still unresolved.  [facility] hospitalist contacted for admission.   ED Course: lipase 651. Ct abd shows pancreatitis  Review of Systems:  Review of Systems  Constitutional: Negative.   HENT: Negative.    Eyes: Negative.   Respiratory: Negative.    Cardiovascular: Negative.   Gastrointestinal:  Positive for abdominal pain, nausea and vomiting.  Genitourinary: Negative.   Musculoskeletal: Negative.   Skin: Negative.   Neurological: Negative.   Endo/Heme/Allergies: Negative.   Psychiatric/Behavioral: Negative.    All other systems reviewed and are negative.   Past Medical History:  Diagnosis Date   Back pain, chronic    Coccygeal fracture (HCC)    Cystic fibrosis    DDD (degenerative disc  disease)    Ovarian cyst    Substance abuse ([place])    Thoracic compression fracture (HCC)     Past Surgical History:  Procedure Laterality Date   LAPAROSCOPY     OVARIAN CYST REMOVAL     TUBAL LIGATION       reports that she has been smoking cigarettes. She has a 10.00 pack-year smoking history. She has never used smokeless tobacco. She reports current alcohol use. She reports that she does not currently use drugs after having used the following drugs: Morphine, Oxycodone, Hydrocodone, and Marijuana.  Allergies  Allergen Reactions   Compazine [Prochlorperazine] Anxiety   Phenergan [Promethazine Hcl] Anxiety    Family History  Problem Relation Age of Onset   Heart failure Mother    Diabetes Mother    Hypertension Father    Heart failure Other    Diabetes Other    Cancer Other     Prior to Admission medications   Medication Sig Start Date End Date Taking? Authorizing Provider  acetaminophen (TYLENOL) 500 MG tablet Take 1 tablet (500 mg total) by mouth every 6 (six) hours as needed for mild pain. 11/20/21   Redwine, Madison A, PA-C  folic acid (FOLVITE) 1 MG tablet Take 1 tablet (1 mg total) by mouth daily. 02/27/21   Mercy Riding, MD  HYDROcodone-acetaminophen (NORCO/VICODIN) 5-325 MG tablet Take 2 tablets by mouth every 4 (four) hours as needed. 11/20/21   Redwine, Madison A, PA-C  Multiple Vitamin (MULTIVITAMIN WITH MINERALS) TABS tablet Take 1 tablet by mouth daily. 02/27/21  Mercy Riding, MD  ondansetron (ZOFRAN) 4 MG tablet Take 1 tablet (4 mg total) by mouth every 6 (six) hours. 11/20/21   Redwine, Madison A, PA-C  thiamine 100 MG tablet Take 1 tablet (100 mg total) by mouth daily. 02/27/21   Mercy Riding, MD    Physical Exam: Vitals:   06/21/22 2018 06/21/22 2021 06/21/22 2243 06/21/22 2315  BP: (!) 153/94 (!) 155/94  (!) 164/105  Pulse: (!) 102 (!) 102 92 (!) 103  Resp: _0 Temp: 98.7 F (37.1 C) 98.7 F (37.1 C)    TempSrc: Oral Oral    SpO2: 100% 100% 98%  98%  Weight:      Height:        Physical Exam Vitals and nursing note reviewed.  Constitutional:      General: She is not in acute distress.    Appearance: Normal appearance. She is not ill-appearing, toxic-appearing or diaphoretic.  HENT:     Head: Normocephalic and atraumatic.     Nose: Nose normal.  Cardiovascular:     Rate and Rhythm: Regular rhythm. Tachycardia present.     Pulses: Normal pulses.  Pulmonary:     Effort: Pulmonary effort is normal. No respiratory distress.     Breath sounds: Normal breath sounds. No wheezing or rales.  Abdominal:     General: Bowel sounds are normal.     Tenderness: There is abdominal tenderness in the epigastric area. There is no guarding or rebound.  Musculoskeletal:     Right lower leg: No edema.     Left lower leg: No edema.  Skin:    General: Skin is warm and dry.     Capillary Refill: Capillary refill takes less than 2 seconds.  Neurological:     General: No focal deficit present.     Mental Status: She is alert and oriented to person, place, and time.      Labs on Admission: I have personally reviewed following labs and imaging studies  CBC: Recent Labs  Lab 06/21/22 1511  WBC 11.5*  HGB 16.2*  HCT 47.1*  MCV 98.3  PLT 161   Basic Metabolic Panel: Recent Labs  Lab 06/21/22 1511  NA 136  K 3.5  CL 102  CO2 23  GLUCOSE 111*  BUN 10  CREATININE 0.44  CALCIUM 9.3   GFR: Estimated Creatinine Clearance: 82.4 mL/min (by C-G formula based on SCr of 0.44 mg/dL). Liver Function Tests: Recent Labs  Lab 06/21/22 1511  AST 51*  ALT 37  ALKPHOS 202*  BILITOT 1.1  PROT 8.8*  ALBUMIN 4.0   Recent Labs  Lab 06/21/22 1511  LIPASE 651*   No results for input(s): "AMMONIA" in the last 168 hours. Coagulation Profile: No results for input(s): "INR", "PROTIME" in the last 168 hours. Cardiac Enzymes: No results for input(s): "CKTOTAL", "CKMB", "CKMBINDEX", "TROPONINI", "TROPONINIHS" in the last 168 hours. BNP  (last 3 results) No results for input(s): "PROBNP" in the last 8760 hours. HbA1C: No results for input(s): "HGBA1C" in the last 72 hours. CBG: No results for input(s): "GLUCAP" in the last 168 hours. Lipid Profile: No results for input(s): "CHOL", "HDL", "LDLCALC", "TRIG", "CHOLHDL", "LDLDIRECT" in the last 72 hours. Thyroid Function Tests: No results for input(s): "TSH", "T4TOTAL", "FREET4", "T3FREE", "THYROIDAB" in the last 72 hours. Anemia Panel: No results for input(s): "VITAMINB12", "FOLATE", "FERRITIN", "TIBC", "IRON", "RETICCTPCT" in the last 72 hours. Urine analysis:    Component Value Date/Time   COLORURINE YELLOW  11/20/2021 [place] 11/20/2021 1150   LABSPEC 1.014 11/20/2021 1150   PHURINE 5.0 11/20/2021 1150   GLUCOSEU NEGATIVE 11/20/2021 1150   HGBUR NEGATIVE 11/20/2021 [place] 11/20/2021 1150   KETONESUR 80 (A) 11/20/2021 1150   PROTEINUR NEGATIVE 11/20/2021 1150   UROBILINOGEN 1.0 02/08/2013 0422   NITRITE NEGATIVE 11/20/2021 1150   LEUKOCYTESUR NEGATIVE 11/20/2021 1150    Radiological Exams on Admission: I have personally reviewed images CT ABDOMEN PELVIS W CONTRAST  Result Date: 06/21/2022 CLINICAL DATA:  Epigastric pain likely pancreatitis EXAM: CT ABDOMEN AND PELVIS WITH CONTRAST TECHNIQUE: Multidetector CT imaging of the abdomen and pelvis was performed using the standard protocol following bolus administration of intravenous contrast. RADIATION DOSE REDUCTION: This exam was performed according to the departmental dose-optimization program which includes automated exposure control, adjustment of the mA and/or kV according to patient size and/or use of iterative reconstruction technique. CONTRAST:  68m OMNIPAQUE IOHEXOL 300 MG/ML  SOLN COMPARISON:  CT 11/20/2021 FINDINGS: Lower chest: No acute abnormality. Hepatobiliary: Nodular contour of the liver compatible with cirrhosis. Similar appearance of the liver with geographic areas  of heterogenous attenuation. No definite hepatic mass. Hepatic steatosis. Gallbladder is unremarkable. No biliary dilation. Pancreas: Peripancreatic edema and stranding compatible with pancreatitis, increased from 11/20/2021. No definite areas of parenchymal necrosis. Heterogenous enhancement in the pancreatic head appears unchanged from 11/20/2021. Spleen: Normal in size without focal abnormality. Adrenals/Urinary Tract: Adrenal glands are unremarkable. Kidneys are normal, without renal calculi, focal lesion, or hydronephrosis. Bladder is unremarkable. Stomach/Bowel: Stomach is within normal limits. Appendix appears normal. No evidence of bowel [place] thickening, distention, or inflammatory changes. Vascular/Lymphatic: Patent portal vein. Aortic atherosclerosis. No enlarged abdominal or pelvic lymph nodes. Reproductive: Uterus and bilateral adnexa are unremarkable. Tubal ligation clip on the left. Other: No free intraperitoneal air. Musculoskeletal: No acute or significant osseous findings. IMPRESSION: Acute uncomplicated pancreatitis. Acute peripancreatic fluid is slightly increased compared with 11/20/2021. Cirrhosis. As recommended on CT abdomen and pelvis 11/20/2021 a nonemergent contrast-enhanced MRI is recommended when the patient's clinical condition allows if not performed previously. Aortic Atherosclerosis (ICD10-I70.0). Electronically Signed   By: TPlacido SouM.D.   On: 06/21/2022 17:24    EKG: My personal interpretation of EKG shows: no EKG to review  Assessment/Plan Principal Problem:   Alcoholic pancreatitis Active Problems:   Generalized anxiety disorder    Assessment and Plan: * Alcoholic pancreatitis Observation med/surg bed. Continue with IVF. Pt states that IV toradol helped with pain last time she was admitted to hospital. Will continue with IV toradol 15 mg q6h while on Ivf. Prn IV dilaudid 0.5 mg severe pain. Po oxycodone for moderate pain. Pt with hx of opiate abuse in the past.  Check UDS. IV protonix.  Generalized anxiety disorder Continue with paxil 20 mg daily.   DVT prophylaxis: SQ Heparin Code Status: Full Code Family Communication: no family at bedside  Disposition Plan: return home  Consults called: none  Admission status: Observation, Med-Surg   EKristopher Oppenheim DO [facility] 06/21/2022, 11:26 PM

## 2022-06-21 NOTE — Assessment & Plan Note (Addendum)
Observation med/surg bed. Continue with IVF. Pt states that IV toradol helped with pain last time she was admitted to hospital. Will continue with IV toradol 15 mg q6h while on Ivf. Prn IV dilaudid 0.5 mg severe pain. Po oxycodone for moderate pain. Pt with hx of opiate abuse in the past. Check UDS. IV protonix.

## 2022-06-21 NOTE — ED Provider Notes (Signed)
Ultrasound ED Peripheral IV (Provider)  Date/Time: 06/21/2022 2:57 PM  Performed by: Gailen Shelter, PA Authorized by: Gailen Shelter, PA   Procedure details:    Indications: multiple failed IV attempts     Skin Prep: chlorhexidine gluconate     Angiocath:  20 G   Bedside Ultrasound Guided: Yes     Images: not archived     Patient tolerated procedure without complications: Yes     Dressing applied: Yes       Gailen Shelter, PA 06/21/22 1458    Gerhard Munch, MD 06/21/22 1626

## 2022-06-22 ENCOUNTER — Encounter (HOSPITAL_COMMUNITY): Payer: Self-pay | Admitting: Internal Medicine

## 2022-06-22 DIAGNOSIS — Z79899 Other long term (current) drug therapy: Secondary | ICD-10-CM | POA: Diagnosis not present

## 2022-06-22 DIAGNOSIS — E860 Dehydration: Secondary | ICD-10-CM | POA: Diagnosis present

## 2022-06-22 DIAGNOSIS — M549 Dorsalgia, unspecified: Secondary | ICD-10-CM | POA: Diagnosis present

## 2022-06-22 DIAGNOSIS — K852 Alcohol induced acute pancreatitis without necrosis or infection: Secondary | ICD-10-CM | POA: Diagnosis present

## 2022-06-22 DIAGNOSIS — Z87891 Personal history of nicotine dependence: Secondary | ICD-10-CM | POA: Diagnosis not present

## 2022-06-22 DIAGNOSIS — G8929 Other chronic pain: Secondary | ICD-10-CM | POA: Diagnosis present

## 2022-06-22 DIAGNOSIS — F411 Generalized anxiety disorder: Secondary | ICD-10-CM | POA: Diagnosis present

## 2022-06-22 DIAGNOSIS — Z780 Asymptomatic menopausal state: Secondary | ICD-10-CM | POA: Diagnosis not present

## 2022-06-22 DIAGNOSIS — Z833 Family history of diabetes mellitus: Secondary | ICD-10-CM | POA: Diagnosis not present

## 2022-06-22 DIAGNOSIS — Z8249 Family history of ischemic heart disease and other diseases of the circulatory system: Secondary | ICD-10-CM | POA: Diagnosis not present

## 2022-06-22 DIAGNOSIS — Z888 Allergy status to other drugs, medicaments and biological substances status: Secondary | ICD-10-CM | POA: Diagnosis not present

## 2022-06-22 LAB — URINALYSIS, ROUTINE W REFLEX MICROSCOPIC
Bacteria, UA: NONE SEEN
Bilirubin Urine: NEGATIVE
Glucose, UA: NEGATIVE mg/dL
Hgb urine dipstick: NEGATIVE
Ketones, ur: NEGATIVE mg/dL
Nitrite: NEGATIVE
Protein, ur: NEGATIVE mg/dL
Specific Gravity, Urine: 1.014 (ref 1.005–1.030)
pH: 6 (ref 5.0–8.0)

## 2022-06-22 LAB — RAPID URINE DRUG SCREEN, HOSP PERFORMED
Amphetamines: NOT DETECTED
Barbiturates: NOT DETECTED
Benzodiazepines: NOT DETECTED
Cocaine: NOT DETECTED
Opiates: NOT DETECTED
Tetrahydrocannabinol: NOT DETECTED

## 2022-06-22 LAB — CBC WITH DIFFERENTIAL/PLATELET
Abs Immature Granulocytes: 0.03 10*3/uL (ref 0.00–0.07)
Basophils Absolute: 0 10*3/uL (ref 0.0–0.1)
Basophils Relative: 0 %
Eosinophils Absolute: 0.1 10*3/uL (ref 0.0–0.5)
Eosinophils Relative: 1 %
HCT: 40.7 % (ref 36.0–46.0)
Hemoglobin: 13.5 g/dL (ref 12.0–15.0)
Immature Granulocytes: 0 %
Lymphocytes Relative: 33 %
Lymphs Abs: 3.6 10*3/uL (ref 0.7–4.0)
MCH: 33.4 pg (ref 26.0–34.0)
MCHC: 33.2 g/dL (ref 30.0–36.0)
MCV: 100.7 fL — ABNORMAL HIGH (ref 80.0–100.0)
Monocytes Absolute: 0.8 10*3/uL (ref 0.1–1.0)
Monocytes Relative: 7 %
Neutro Abs: 6.5 10*3/uL (ref 1.7–7.7)
Neutrophils Relative %: 59 %
Platelets: 294 10*3/uL (ref 150–400)
RBC: 4.04 MIL/uL (ref 3.87–5.11)
RDW: 13 % (ref 11.5–15.5)
WBC: 11.1 10*3/uL — ABNORMAL HIGH (ref 4.0–10.5)
nRBC: 0 % (ref 0.0–0.2)

## 2022-06-22 LAB — COMPREHENSIVE METABOLIC PANEL
ALT: 26 U/L (ref 0–44)
AST: 37 U/L (ref 15–41)
Albumin: 3.1 g/dL — ABNORMAL LOW (ref 3.5–5.0)
Alkaline Phosphatase: 148 U/L — ABNORMAL HIGH (ref 38–126)
Anion gap: 8 (ref 5–15)
BUN: 10 mg/dL (ref 6–20)
CO2: 25 mmol/L (ref 22–32)
Calcium: 8.3 mg/dL — ABNORMAL LOW (ref 8.9–10.3)
Chloride: 102 mmol/L (ref 98–111)
Creatinine, Ser: 0.49 mg/dL (ref 0.44–1.00)
GFR, Estimated: >60 mL/min (ref >60)
Glucose, Bld: 91 mg/dL (ref 70–99)
Potassium: 3.4 mmol/L — ABNORMAL LOW (ref 3.5–5.1)
Sodium: 135 mmol/L (ref 135–145)
Total Bilirubin: 1.1 mg/dL (ref 0.3–1.2)
Total Protein: 6.8 g/dL (ref 6.5–8.1)

## 2022-06-22 LAB — LIPASE, BLOOD: Lipase: 287 U/L — ABNORMAL HIGH (ref 11–51)

## 2022-06-22 LAB — HIV ANTIBODY (ROUTINE TESTING W REFLEX): HIV Screen 4th Generation wRfx: NONREACTIVE

## 2022-06-22 LAB — MAGNESIUM: Magnesium: 1.9 mg/dL (ref 1.7–2.4)

## 2022-06-22 MED ORDER — ONDANSETRON HCL 4 MG/2ML IJ SOLN: 4.0000 mg | Freq: Four times a day (QID) | INTRAMUSCULAR | Status: DC | PRN

## 2022-06-22 MED ORDER — NICOTINE 14 MG/24HR TD PT24: 14.0000 mg | MEDICATED_PATCH | Freq: Every day | TRANSDERMAL | Status: DC | PRN

## 2022-06-22 MED ORDER — SODIUM CHLORIDE 0.9 % IV SOLN: INTRAVENOUS | Status: DC | PRN

## 2022-06-22 MED ORDER — ACETAMINOPHEN 325 MG PO TABS: 650.0000 mg | ORAL_TABLET | Freq: Four times a day (QID) | ORAL | Status: DC | PRN

## 2022-06-22 MED ORDER — HEPARIN SODIUM (PORCINE) 5000 UNIT/ML IJ SOLN
5000.0000 [IU] | Freq: Three times a day (TID) | INTRAMUSCULAR | Status: DC
  Administered 2022-06-22 – 2022-06-23 (×4): 5000 [IU] via SUBCUTANEOUS
  Filled 2022-06-22 (×4): qty 1

## 2022-06-22 MED ORDER — HYDROMORPHONE HCL 1 MG/ML IJ SOLN
0.5000 mg | INTRAMUSCULAR | Status: DC | PRN
  Administered 2022-06-22 (×3): 0.5 mg via INTRAVENOUS
  Filled 2022-06-22 (×3): qty 0.5

## 2022-06-22 MED ORDER — POTASSIUM CHLORIDE 10 MEQ/100ML IV SOLN
10.0000 meq | Freq: Once | INTRAVENOUS | Status: AC
  Administered 2022-06-22: 10 meq via INTRAVENOUS
  Filled 2022-06-22: qty 100

## 2022-06-22 MED ORDER — LACTATED RINGERS IV SOLN: INTRAVENOUS | Status: DC

## 2022-06-22 MED ORDER — ACETAMINOPHEN 650 MG RE SUPP: 650.0000 mg | Freq: Four times a day (QID) | RECTAL | Status: DC | PRN

## 2022-06-22 MED ORDER — PANTOPRAZOLE SODIUM 40 MG IV SOLR
40.0000 mg | Freq: Two times a day (BID) | INTRAVENOUS | Status: DC
  Administered 2022-06-22 – 2022-06-23 (×4): 40 mg via INTRAVENOUS
  Filled 2022-06-22 (×4): qty 10

## 2022-06-22 MED ORDER — ALUM & MAG HYDROXIDE-SIMETH 200-200-20 MG/5ML PO SUSP
30.0000 mL | ORAL | Status: DC | PRN
  Administered 2022-06-22 – 2022-06-23 (×2): 30 mL via ORAL
  Filled 2022-06-22 (×2): qty 30

## 2022-06-22 MED ORDER — MELATONIN 5 MG PO TABS
10.0000 mg | ORAL_TABLET | Freq: Every evening | ORAL | Status: DC | PRN
  Administered 2022-06-22 (×2): 10 mg via ORAL
  Filled 2022-06-22 (×2): qty 2

## 2022-06-22 MED ORDER — ONDANSETRON HCL 4 MG PO TABS: 4.0000 mg | ORAL_TABLET | Freq: Four times a day (QID) | ORAL | Status: DC | PRN

## 2022-06-22 MED ORDER — OXYCODONE HCL 5 MG PO TABS
5.0000 mg | ORAL_TABLET | ORAL | Status: DC | PRN
  Administered 2022-06-22 – 2022-06-23 (×5): 5 mg via ORAL
  Filled 2022-06-22 (×5): qty 1

## 2022-06-22 NOTE — Progress Notes (Signed)
[facility] Progress Note Patient: KEERA ALTIDOR MBW:[id number] DOB: 1981/07/18 DOA: 06/21/2022  DOS: the patient was seen and examined on 06/22/2022  Brief hospital course: PMH of recurrent pancreatitis, alcohol, anxiety disorder, presented to hospital with complaints of abdominal pain.  Found to have acute alcoholic induced pancreatitis.  Currently continue current pain management.  Advancing to clear liquid diet. Assessment and Plan: Acute alcohol induced pancreatitis. Continue pain medication. Continue IV fluid. Add clear liquid diet and monitor. If tolerates well will consider full liquid diet tomorrow.   Subjective etc. On Paxil.  Continue.  History of opioid abuse. Monitor  UDS negative   Subjective: Wants to eat.  Pain still present but improving.  No nausea or vomiting.  Passing gas but no BM.  Physical Exam: General: in mild distress;  Cardiovascular: S1 and S2 Present, no murmur Respiratory: Normal respiratory effort, Bilateral Air entry present, no Crackles, no wheezes Abdomen: Bowel Sound present, diffusely tender in the epigastric region, nondistended Extremities: no edema Neurology: alert and oriented to time, place, and person   Data Reviewed: I have Reviewed nursing notes, Vitals, and Lab results. Since last encounter, pertinent lab results CBC and BMP and lipase level   . I have ordered test including CBC and BMP and lipase level  .   Disposition: Status is: Inpatient Remains inpatient appropriate because: Need for IV fluids and conservative management for pancreatitis  heparin injection 5,000 Units Start: 06/22/22 0600 SCDs Start: 06/22/22 0058   Family Communication: No one at bedside Level of care: Med-Surg  Vitals:   06/22/22 0542 06/22/22 0849 06/22/22 1309 06/22/22 1740  BP: (!) 148/86 132/87 121/82 105/76  Pulse: 80 73 74 70  Resp: 16 18 14 16   Temp: 98.1 F (36.7 C) 98.2 F (36.8 C) 97.8 F (36.6 C) 98.5 F (36.9 C)  TempSrc: Oral      SpO2: 99% 100% 98% 100%  Weight:      Height:         Author: , MD 06/22/2022 7:05 PM  Please look on [email or web address] to find out who is on call.

## 2022-06-22 NOTE — Hospital Course (Signed)
PMH of recurrent pancreatitis, alcohol, anxiety disorder, presented to hospital with complaints of abdominal pain.  Found to have acute alcoholic induced pancreatitis.  Currently continue current pain management.  Advancing to clear liquid diet.

## 2022-06-23 MED ORDER — ONDANSETRON HCL 4 MG PO TABS: 4.0000 mg | ORAL_TABLET | Freq: Three times a day (TID) | ORAL | 0 refills | Status: DC | PRN

## 2022-06-23 MED ORDER — FOLIC ACID 1 MG PO TABS: 1.0000 mg | ORAL_TABLET | Freq: Every day | ORAL | 0 refills | Status: DC

## 2022-06-23 MED ORDER — KETOROLAC TROMETHAMINE 15 MG/ML IJ SOLN
15.0000 mg | Freq: Four times a day (QID) | INTRAMUSCULAR | Status: DC
  Administered 2022-06-23 (×2): 15 mg via INTRAVENOUS
  Filled 2022-06-23 (×2): qty 1

## 2022-06-23 MED ORDER — NICOTINE 14 MG/24HR TD PT24: 14.0000 mg | MEDICATED_PATCH | Freq: Every day | TRANSDERMAL | 0 refills | Status: DC | PRN

## 2022-06-23 MED ORDER — PANTOPRAZOLE SODIUM 40 MG PO TBEC: 40.0000 mg | DELAYED_RELEASE_TABLET | Freq: Every day | ORAL | 0 refills | Status: DC

## 2022-06-23 MED ORDER — HYDROCODONE-ACETAMINOPHEN 5-325 MG PO TABS: 1.0000 | ORAL_TABLET | Freq: Three times a day (TID) | ORAL | 0 refills | Status: DC | PRN

## 2022-06-25 NOTE — Discharge Summary (Signed)
Physician Discharge Summary   Patient: Marisa Palmer MRN: [id number] DOB: Nov 23, 1980  Admit date:     06/21/2022  Discharge date: 06/23/2022  Discharge Physician: Lynden Oxford  PCP: Patient, No Pcp Per  Recommendations at discharge: Follow-up with PCP in 1 week   Follow-up Information     PCP. Schedule an appointment as soon as possible for a visit in 1 week(s).                 Discharge Diagnoses: Principal Problem:   Alcoholic pancreatitis Active Problems:   Acute alcoholic pancreatitis   Generalized anxiety disorder  Hospital Course: PMH of recurrent pancreatitis, alcohol, anxiety disorder, presented to hospital with complaints of abdominal pain.  Found to have acute alcoholic induced pancreatitis.  Currently continue current pain management.  Tolerating oral diet. Assessment and Plan  Acute alcohol induced pancreatitis. Treated conservatively with pain medication, IV fluids. Patient is able to tolerate oral diet without any abdominal pain. No nausea or vomiting reported as well. Recommended discontinue alcohol abuse.  Subjective etc. On Paxil.  Continue.   History of opioid abuse. Monitor  UDS negative   Consultants:  none  Procedures performed:  none  DISCHARGE MEDICATION: Allergies as of 06/23/2022       Reactions   Compazine [prochlorperazine] Anxiety   Phenergan [promethazine Hcl] Anxiety        Medication List     TAKE these medications    acetaminophen 500 MG tablet Commonly known as: TYLENOL Take 1 tablet (500 mg total) by mouth every 6 (six) hours as needed for mild pain.   folic acid 1 MG tablet Commonly known as: FOLVITE Take 1 tablet (1 mg total) by mouth daily.   HYDROcodone-acetaminophen 5-325 MG tablet Commonly known as: NORCO/VICODIN Take 1 tablet by mouth every 8 (eight) hours as needed for moderate pain or severe pain. What changed:  how much to take when to take this reasons to take this   multivitamin with  minerals Tabs tablet Take 1 tablet by mouth daily.   nicotine 14 mg/24hr patch Commonly known as: NICODERM CQ - dosed in mg/24 hours Place 1 patch (14 mg total) onto the skin daily as needed (nicotine withdrawal).   ondansetron 4 MG tablet Commonly known as: ZOFRAN Take 1 tablet (4 mg total) by mouth every 8 (eight) hours as needed for nausea or vomiting. What changed:  when to take this reasons to take this   pantoprazole 40 MG tablet Commonly known as: Protonix Take 1 tablet (40 mg total) by mouth daily for 14 days.   PARoxetine 20 MG tablet Commonly known as: PAXIL Take 20 mg by mouth daily.       Disposition: Home Diet recommendation: Cardiac diet  Discharge Exam: Vitals:   06/22/22 2040 06/23/22 0500 06/23/22 0656 06/23/22 1317  BP: 117/80  106/72 112/75  Pulse: 64  75 83  Resp: 18  18 18   Temp: 98.3 F (36.8 C)  98.1 F (36.7 C) 98.1 F (36.7 C)  TempSrc: Oral  Oral Oral  SpO2: 98%  98% 97%  Weight:  65.3 kg    Height:       General: Appear in no distress; no visible Abnormal Neck Mass Or lumps, Conjunctiva normal Cardiovascular: S1 and S2 Present, no Murmur, Respiratory: good respiratory effort, Bilateral Air entry present and CTA, no Crackles, no wheezes Abdomen: Bowel Sound present, mild epigastric tenderness Extremities: no Pedal edema Neurology: alert and oriented to time, place, and person   06/22/22 0054 06/22/22 0500 06/23/22 0500  Weight: 62.9 kg 64 kg 65.3 kg   Condition at discharge: stable  The results of significant diagnostics from this hospitalization (including imaging, microbiology, ancillary and laboratory) are listed below for reference.   Imaging Studies: CT ABDOMEN PELVIS W CONTRAST  Result Date: 06/21/2022 CLINICAL DATA:  Epigastric pain likely pancreatitis EXAM: CT ABDOMEN AND PELVIS WITH CONTRAST TECHNIQUE: Multidetector CT imaging of the abdomen and pelvis was performed using the standard protocol following  bolus administration of intravenous contrast. RADIATION DOSE REDUCTION: This exam was performed according to the departmental dose-optimization program which includes automated exposure control, adjustment of the mA and/or kV according to patient size and/or use of iterative reconstruction technique. CONTRAST:  42mL OMNIPAQUE IOHEXOL 300 MG/ML  SOLN COMPARISON:  CT 11/20/2021 FINDINGS: Lower chest: No acute abnormality. Hepatobiliary: Nodular contour of the liver compatible with cirrhosis. Similar appearance of the liver with geographic areas of heterogenous attenuation. No definite hepatic mass. Hepatic steatosis. Gallbladder is unremarkable. No biliary dilation. Pancreas: Peripancreatic edema and stranding compatible with pancreatitis, increased from 11/20/2021. No definite areas of parenchymal necrosis. Heterogenous enhancement in the pancreatic head appears unchanged from 11/20/2021. Spleen: Normal in size without focal abnormality. Adrenals/Urinary Tract: Adrenal glands are unremarkable. Kidneys are normal, without renal calculi, focal lesion, or hydronephrosis. Bladder is unremarkable. Stomach/Bowel: Stomach is within normal limits. Appendix appears normal. No evidence of bowel [place] thickening, distention, or inflammatory changes. Vascular/Lymphatic: Patent portal vein. Aortic atherosclerosis. No enlarged abdominal or pelvic lymph nodes. Reproductive: Uterus and bilateral adnexa are unremarkable. Tubal ligation clip on the left. Other: No free intraperitoneal air. Musculoskeletal: No acute or significant osseous findings. IMPRESSION: Acute uncomplicated pancreatitis. Acute peripancreatic fluid is slightly increased compared with 11/20/2021. Cirrhosis. As recommended on CT abdomen and pelvis 11/20/2021 a nonemergent contrast-enhanced MRI is recommended when the patient's clinical condition allows if not performed previously. Aortic Atherosclerosis (ICD10-I70.0). Electronically Signed   By: Minerva Fester M.D.    On: 06/21/2022 17:24    Microbiology: Results for orders placed or performed during the hospital encounter of 02/24/21  Resp Panel by RT-PCR (Flu A&B, Covid) Nasopharyngeal Swab     Status: Abnormal   Collection Time: 02/25/21  3:08 AM   Specimen: Nasopharyngeal Swab; Nasopharyngeal(NP) swabs in vial transport medium  Result Value Ref Range Status   SARS Coronavirus 2 by RT PCR POSITIVE (A) NEGATIVE Final    Comment: RESULT CALLED TO, READ BACK BY AND VERIFIED WITH: KERSCHERI,Q RN @0425  ON 02/25/21 JAKSON,K (NOTE) SARS-CoV-2 target nucleic acids are DETECTED.  The SARS-CoV-2 RNA is generally detectable in upper respiratory specimens during the acute phase of infection. Positive results are indicative of the presence of the identified virus, but do not rule out bacterial infection or co-infection with other pathogens not detected by the test. Clinical correlation with patient history and other diagnostic information is necessary to determine patient infection status. The expected result is Negative.  Fact Sheet for Patients: 04/27/21  Fact Sheet for Healthcare Providers: BloggerCourse.com  This test is not yet approved or cleared by the [employer] FDA and  has been authorized for detection and/or diagnosis of SARS-CoV-2 by FDA under an Emergency Use Authorization (EUA).  This EUA will remain in effect (meaning this test  can be used) for the duration of  the COVID-19 declaration under Section 564(b)(1) of the Act, 21 U.S.C. section 360bbb-3(b)(1), unless the authorization is terminated or revoked sooner.     Influenza A by PCR NEGATIVE NEGATIVE Final  Influenza B by PCR NEGATIVE NEGATIVE Final    Comment: (NOTE) The Xpert Xpress SARS-CoV-2/FLU/RSV plus assay is intended as an aid in the diagnosis of influenza from Nasopharyngeal swab specimens and should not be used as a sole basis for treatment. Nasal washings  and aspirates are unacceptable for Xpert Xpress SARS-CoV-2/FLU/RSV testing.  Fact Sheet for Patients: BloggerCourse.com  Fact Sheet for Healthcare Providers: Serious[employer]ker.it  This test is not yet approved or cleared by the [place] FDA and has been authorized for detection and/or diagnosis of SARS-CoV-2 by FDA under an Emergency Use Authorization (EUA). This EUA will remain in effect (meaning this test can be used) for the duration of the COVID-19 declaration under Section 564(b)(1) of the Act, 21 U.S.C. section 360bbb-3(b)(1), unless the authorization is terminated or revoked.  Performed at [facility], [address] [place], [place] [address]    Labs: CBC: Recent Labs  Lab 06/21/22 1511 06/22/22 0331  WBC 11.5* 11.1*  NEUTROABS  --  6.5  HGB 16.2* 13.5  HCT 47.1* 40.7  MCV 98.3 100.7*  PLT 381 294   Basic Metabolic Panel: Recent Labs  Lab 06/21/22 1511 06/22/22 0331  NA 136 135  K 3.5 3.4*  CL 102 102  CO2 23 25  GLUCOSE 111* 91  BUN 10 10  CREATININE 0.44 0.49  CALCIUM 9.3 8.3*  MG  --  1.9   Liver Function Tests: Recent Labs  Lab 06/21/22 1511 06/22/22 0331  AST 51* 37  ALT 37 26  ALKPHOS 202* 148*  BILITOT 1.1 1.1  PROT 8.8* 6.8  ALBUMIN 4.0 3.1*   CBG: No results for input(s): "GLUCAP" in the last 168 hours.  Discharge time spent: greater than 30 minutes.  Signed: Lynden Oxford, MD [facility] 06/23/2022

## 2022-10-14 ENCOUNTER — Encounter (HOSPITAL_COMMUNITY): Payer: Self-pay

## 2022-10-14 ENCOUNTER — Emergency Department (HOSPITAL_COMMUNITY)
Admission: EM | Admit: 2022-10-14 | Discharge: 2022-10-15 | Disposition: A | Discharge status: Other Acute Inpatient Hospital | Payer: Medicaid Other | Attending: Emergency Medicine | Admitting: Emergency Medicine

## 2022-10-14 ENCOUNTER — Other Ambulatory Visit: Payer: Self-pay

## 2022-10-14 DIAGNOSIS — G4700 Insomnia, unspecified: Secondary | ICD-10-CM | POA: Diagnosis not present

## 2022-10-14 DIAGNOSIS — T1491XA Suicide attempt, initial encounter: Secondary | ICD-10-CM

## 2022-10-14 DIAGNOSIS — F339 Major depressive disorder, recurrent, unspecified: Secondary | ICD-10-CM | POA: Diagnosis not present

## 2022-10-14 DIAGNOSIS — F17210 Nicotine dependence, cigarettes, uncomplicated: Secondary | ICD-10-CM | POA: Insufficient documentation

## 2022-10-14 DIAGNOSIS — D72829 Elevated white blood cell count, unspecified: Secondary | ICD-10-CM | POA: Diagnosis not present

## 2022-10-14 DIAGNOSIS — X58XXXA Exposure to other specified factors, initial encounter: Secondary | ICD-10-CM | POA: Insufficient documentation

## 2022-10-14 DIAGNOSIS — F411 Generalized anxiety disorder: Secondary | ICD-10-CM | POA: Insufficient documentation

## 2022-10-14 DIAGNOSIS — R45851 Suicidal ideations: Secondary | ICD-10-CM

## 2022-10-14 DIAGNOSIS — F1010 Alcohol abuse, uncomplicated: Secondary | ICD-10-CM | POA: Insufficient documentation

## 2022-10-14 DIAGNOSIS — R000 Tachycardia, unspecified: Secondary | ICD-10-CM | POA: Diagnosis not present

## 2022-10-14 DIAGNOSIS — Z20822 Contact with and (suspected) exposure to covid-19: Secondary | ICD-10-CM | POA: Diagnosis not present

## 2022-10-14 DIAGNOSIS — Y907 Blood alcohol level of 200-239 mg/100 ml: Secondary | ICD-10-CM | POA: Diagnosis not present

## 2022-10-14 LAB — COMPREHENSIVE METABOLIC PANEL
ALT: 34 U/L (ref 0–44)
AST: 52 U/L — ABNORMAL HIGH (ref 15–41)
Albumin: 4 g/dL (ref 3.5–5.0)
Alkaline Phosphatase: 172 U/L — ABNORMAL HIGH (ref 38–126)
Anion gap: 14 (ref 5–15)
BUN: 6 mg/dL (ref 6–20)
CO2: 18 mmol/L — ABNORMAL LOW (ref 22–32)
Calcium: 8.6 mg/dL — ABNORMAL LOW (ref 8.9–10.3)
Chloride: 105 mmol/L (ref 98–111)
Creatinine, Ser: 0.51 mg/dL (ref 0.44–1.00)
GFR, Estimated: >60 mL/min (ref >60)
Glucose, Bld: 111 mg/dL — ABNORMAL HIGH (ref 70–99)
Potassium: 3.9 mmol/L (ref 3.5–5.1)
Sodium: 137 mmol/L (ref 135–145)
Total Bilirubin: 0.6 mg/dL (ref 0.3–1.2)
Total Protein: 8.3 g/dL — ABNORMAL HIGH (ref 6.5–8.1)

## 2022-10-14 LAB — RESP PANEL BY RT-PCR (RSV, FLU A&B, COVID)  RVPGX2
Influenza A by PCR: NEGATIVE
Influenza B by PCR: NEGATIVE
Resp Syncytial Virus by PCR: NEGATIVE
SARS Coronavirus 2 by RT PCR: NEGATIVE

## 2022-10-14 LAB — SALICYLATE LEVEL: Salicylate Lvl: <7 mg/dL — ABNORMAL LOW (ref 7.0–30.0)

## 2022-10-14 LAB — RAPID URINE DRUG SCREEN, HOSP PERFORMED
Amphetamines: NOT DETECTED
Barbiturates: NOT DETECTED
Benzodiazepines: NOT DETECTED
Cocaine: NOT DETECTED
Opiates: NOT DETECTED
Tetrahydrocannabinol: NOT DETECTED

## 2022-10-14 LAB — CBC
HCT: 49.1 % — ABNORMAL HIGH (ref 36.0–46.0)
Hemoglobin: 16.7 g/dL — ABNORMAL HIGH (ref 12.0–15.0)
MCH: 33.1 pg (ref 26.0–34.0)
MCHC: 34 g/dL (ref 30.0–36.0)
MCV: 97.2 fL (ref 80.0–100.0)
Platelets: 291 10*3/uL (ref 150–400)
RBC: 5.05 MIL/uL (ref 3.87–5.11)
RDW: 13.7 % (ref 11.5–15.5)
WBC: 11.8 10*3/uL — ABNORMAL HIGH (ref 4.0–10.5)
nRBC: 0 % (ref 0.0–0.2)

## 2022-10-14 LAB — I-STAT BETA HCG BLOOD, ED (MC, WL, AP ONLY): I-stat hCG, quantitative: <5 m[IU]/mL (ref <5)

## 2022-10-14 LAB — ETHANOL: Alcohol, Ethyl (B): 215 mg/dL — ABNORMAL HIGH (ref <10)

## 2022-10-14 LAB — LIPASE, BLOOD: Lipase: 46 U/L (ref 11–51)

## 2022-10-14 LAB — ACETAMINOPHEN LEVEL: Acetaminophen (Tylenol), Serum: <10 ug/mL — ABNORMAL LOW (ref 10–30)

## 2022-10-14 MED ORDER — LORAZEPAM 1 MG PO TABS
1.0000 mg | ORAL_TABLET | ORAL | Status: AC
  Administered 2022-10-14: 1 mg via ORAL
  Filled 2022-10-14: qty 1

## 2022-10-14 MED ORDER — PRAZOSIN HCL 1 MG PO CAPS
1.0000 mg | ORAL_CAPSULE | Freq: Every day | ORAL | Status: DC
  Administered 2022-10-14: 1 mg via ORAL
  Filled 2022-10-14: qty 1

## 2022-10-14 MED ORDER — LORAZEPAM 0.5 MG PO TABS
0.5000 mg | ORAL_TABLET | Freq: Once | ORAL | Status: AC
  Administered 2022-10-14: 0.5 mg via ORAL
  Filled 2022-10-14: qty 1

## 2022-10-14 MED ORDER — HYDROXYZINE HCL 25 MG PO TABS
25.0000 mg | ORAL_TABLET | Freq: Three times a day (TID) | ORAL | Status: DC | PRN
  Administered 2022-10-14 – 2022-10-15 (×2): 25 mg via ORAL
  Filled 2022-10-14 (×2): qty 1

## 2022-10-14 MED ORDER — PAROXETINE HCL 20 MG PO TABS
20.0000 mg | ORAL_TABLET | Freq: Every day | ORAL | Status: DC
  Administered 2022-10-14 – 2022-10-15 (×2): 20 mg via ORAL
  Filled 2022-10-14 (×2): qty 1

## 2022-10-14 NOTE — ED Provider Notes (Signed)
[facility] Provider Note   CSN: [id number] Arrival date & time: 10/14/22  [id number]     History  Chief Complaint  Patient presents with   Suicide Attempt    Marisa Palmer is a 42 y.o. female with medical history of chronic back pain, coccygeal fracture, cystic fibrosis, DDD, substance use, thoracic compression fractures.  Patient presents to ED for evaluation of suicidality.  Patient reports that she has been off of her Paxil for the last 3.5 weeks.  Patient reports that she has stopped taking her Paxil secondary to her doctor discharging her from the practice.  Patient states that she was having issues getting to her doctor's office because of a DUI that she received, does not have a license anymore.  Patient states that she missed multiple appointments and ultimately her PCP discharged from the practice.  Patient states that she has been without her Paxil for 3 and half weeks.  Patient states that tonight she attempted to walk out into traffic however stop herself at the last second because she remembered her children.  Patient states that she has been institutionalized in the past.  Patient denies a history of self-harm.  Patient goes on to report that she has drinking tonight, 1 bottle of wine, reports that she has done this frequently in the past in order to try to go to sleep.  Patient states that she has a history of pancreatitis, has had some abdominal pain for the last 3 days.  Patient states that she has not had a good night of sleep in 1 week.  Patient reports that she "just does not feel like her self".  Patient denies AVH, HI.  HPI     Home Medications Prior to Admission medications   Medication Sig Start Date End Date Taking? Authorizing Provider  acetaminophen (TYLENOL) 500 MG tablet Take 1 tablet (500 mg total) by mouth every 6 (six) hours as needed for mild pain. Patient not taking: Reported on 06/21/2022 11/20/21   Redwine, Madison A,  PA-C  folic acid (FOLVITE) 1 MG tablet Take 1 tablet (1 mg total) by mouth daily. 06/23/22   Lavina Hamman, MD  HYDROcodone-acetaminophen (NORCO/VICODIN) 5-325 MG tablet Take 1 tablet by mouth every 8 (eight) hours as needed for moderate pain or severe pain. 06/23/22   Lavina Hamman, MD  Multiple Vitamin (MULTIVITAMIN WITH MINERALS) TABS tablet Take 1 tablet by mouth daily. 02/27/21   Mercy Riding, MD  nicotine (NICODERM CQ - DOSED IN MG/24 HOURS) 14 mg/24hr patch Place 1 patch (14 mg total) onto the skin daily as needed (nicotine withdrawal). 06/23/22   Lavina Hamman, MD  ondansetron (ZOFRAN) 4 MG tablet Take 1 tablet (4 mg total) by mouth every 8 (eight) hours as needed for nausea or vomiting. 06/23/22   Lavina Hamman, MD  pantoprazole (PROTONIX) 40 MG tablet Take 1 tablet (40 mg total) by mouth daily for 14 days. 06/23/22 07/07/22  Lavina Hamman, MD  PARoxetine (PAXIL) 20 MG tablet Take 20 mg by mouth daily. 05/15/22   [provider]      Allergies    Compazine [prochlorperazine] and Phenergan [promethazine hcl]    Review of Systems   Review of Systems  All other systems reviewed and are negative.   Physical Exam Updated Vital Signs BP (!) 134/106 (BP Location: Left Arm)   Pulse (!) 110   Temp 98.1 F (36.7 C) (Oral)   Resp Marland Kitchen)  25   SpO2 97%  Physical Exam  ED Results / Procedures / Treatments   Labs (all labs ordered are listed, but only abnormal results are displayed) Labs Reviewed  COMPREHENSIVE METABOLIC PANEL - Abnormal; Notable for the following components:      Result Value   CO2 18 (*)    Glucose, Bld 111 (*)    Calcium 8.6 (*)    Total Protein 8.3 (*)    AST 52 (*)    Alkaline Phosphatase 172 (*)    All other components within normal limits  ETHANOL - Abnormal; Notable for the following components:   Alcohol, Ethyl (B) 215 (*)    All other components within normal limits  SALICYLATE LEVEL - Abnormal; Notable for the following components:    Salicylate Lvl [id number] (*)    All other components within normal limits  ACETAMINOPHEN LEVEL - Abnormal; Notable for the following components:   Acetaminophen (Tylenol), Serum <10 (*)    All other components within normal limits  CBC - Abnormal; Notable for the following components:   WBC 11.8 (*)    Hemoglobin 16.7 (*)    HCT 49.1 (*)    All other components within normal limits  RAPID URINE DRUG SCREEN, HOSP PERFORMED  LIPASE, BLOOD  I-STAT BETA HCG BLOOD, ED ([facility], [facility], [facility] ONLY)    EKG EKG Interpretation  Date/Time:  Friday October 14 2022 04:42:38 EDT Ventricular Rate:  111 PR Interval:  128 QRS Duration: 97 QT Interval:  349 QTC Calculation: 475 R Axis:   77 Text Interpretation: Sinus tachycardia Probable left atrial enlargement Baseline wander in lead(s) V1 V2 V3 V4 Confirmed by Quintella Reichert [phone]) on 10/14/2022 4:59:53 AM  Radiology No results found.  Procedures Procedures   Medications Ordered in ED Medications  PARoxetine (PAXIL) tablet 20 mg (has no administration in time range)  LORazepam (ATIVAN) tablet 0.5 mg (0.5 mg Oral Given 10/14/22 0526)    ED Course/ Medical Decision Making/ A&P  Medical Decision Making Amount and/or Complexity of Data Reviewed Labs: ordered.  Risk Prescription drug management.   42 year old female presents to the ED for evaluation.  Please see HPI for further details.  On examination patient afebrile, tachycardic to 110 most likely secondary to alcohol use.  Patient lung sounds are clear bilaterally, she is not hypoxic.  Abdomen is soft and compressible throughout.  Neurological examination at baseline without focal neurodeficits.  Patient here reporting that she attempted suicide prior to arrival.  Patient will be IVC for this.  IVC paperwork has been filled out at this time.  CBC with leukocytosis to 11.8, no anemia.  CMP with elevated alk phos, AST most likely secondary to alcohol use.  Patient ethanol level elevated at 215.   Patient salicylate, acetaminophen level undetectable.  Patient rapid urine drug screen negative for all.  Lipase within normal limits.  EKG nonischemic.  Patient provided 0.5 mg Ativan for agitation, shown to be resting comfortably on reassessment.  Patient also has had her 20 mg of Paxil ordered however has not been given.  At this time, the patient is medically stable and cleared for TTS disposition.  Final Clinical Impression(s) / ED Diagnoses Final diagnoses:  Suicide attempt [facility])  Suicidal ideation    Rx / DC Orders ED Discharge Orders     None         Lawana Chambers 10/14/22 [id number]    Quintella Reichert, MD 10/14/22 2312

## 2022-10-14 NOTE — ED Triage Notes (Signed)
Pt states that she has been off her antidepressants for the past 3 weeks. Tonight has been having SI thoughts and attempted to walk into traffic, pt is tearful in triage. Denies HI/ AVH. Pt reports that she has not been able to sleep in the past week. Reports ETOH use.

## 2022-10-14 NOTE — ED Notes (Signed)
Pt dressed out into purple scrubs. Pt removed necklace and ring placed into purse. Pt has 2 personal bags placed in Patient Belongings 19-22. Also pt has a cell phone inside her purse.

## 2022-10-14 NOTE — ED Notes (Signed)
Pt has sitter at bedside 

## 2022-10-14 NOTE — ED Notes (Signed)
Patient has been sleeping. Breaths equal and unlabored.

## 2022-10-14 NOTE — ED Notes (Signed)
Patient to room 31. Patient ambulated to room, escorted by sitter. Patient cooperative and tearful. Patient oriented to room and unit.

## 2022-10-14 NOTE — Consult Note (Signed)
[place] ED ASSESSMENT   Reason for Consult:  Psych Consult  Referring Physician:  Genevive Bi, PA-C Patient Identification: Marisa Palmer MRN:  [id number] ED Chief Complaint: ETOH abuse  Diagnosis:  Principal Problem:   ETOH abuse Active Problems:   Generalized anxiety disorder   Major depressive disorder, recurrent episode ([place])   Insomnia   ED Assessment Time Calculation: Start Time: 0945 Stop Time: 1030 Total Time in Minutes (Assessment Completion): 45   HPI: Per Triage Note: Pt states that she has been off her antidepressants for the past 3 weeks. Tonight has been having SI thoughts and attempted to walk into traffic, pt is tearful in triage. Denies HI/ AVH. Pt reports that she has not been able to sleep in the past week. Reports ETOH use.    Subjective: Marisa Palmer, 42 y.o., female patient seen face to face by this provider, consulted with Dr. Dwyane Dee; and chart reviewed on 10/14/22.  On evaluation Marisa Palmer is tearful, sitting on edge of bed.  Patient states that she lives with her best friend, she does not work, she feels completely isolated (do not want to go anywhere or do anything).  Patient states that she is having a difficult time getting some sleep, patient says she does not sleep well and probably has not slept in about 3 nights.  States that her sleep and appetite are poor.  She states that she lost 85 pounds in the last 2 years due to her "nerves ".  States that she has not been able to get her Paxil, and has not been on it for 3 to 4 weeks.  Patient states that she has had sleep issues all her life, she has also been experiencing nightmares and terrors.  She states that she has been on different medications for sleep such as Tylenol PM, trazodone, and Ambien, and melatonin.  Patient denies HI/AVH, continues to endorse SI, not sure where her plan is states that when she is tired she has irrational and impulsive thinking.  Patient's blood alcohol level 215 states that she has  been drinking on and off for 20 years, mainly wine.  UDS is negative patient denies using any illicit substances.  Patient states the only medication that she takes now is Paxil for depression which she has not taken in about 3 to 4 weeks, says she used to take benzodiazepines for her anxiety, but does not take it anymore, did not say why.  Patient states that she has been admitted to an inpatient psychiatric facility about 20 years ago when she had her daughter, due to depression, anxiety, and not being able to sleep. She is alert, oriented x 4, anxious but cooperative and attentive. Her mood is anxious an sad with congruent affect. She has pressure speech, and anxious behavior.  Objectively there is no evidence of psychosis/mania or delusional thinking.  Patient is able to converse coherently, no distractibility, or pre-occupation. She denies homicidal ideation, psychosis, and paranoia.  Patient continues to endorse SI, stating "I feel so miserable and hopeless, like I cannot get any sleep at all, nothing helps." Patient has acute alcohol pancreatitis, per chart.  Patient appears that she wants to get help for anxiety, depression, and insomnia, patient is very anxious, and states she just wants to get some sleep., Past Psychiatric History: Per chart review patient has a history of MDD, GAD, and narcotic abuse.  Risk to Self or Others: Is the patient at risk to self? Yes Has the patient  been a risk to self in the past 6 months? No Has the patient been a risk to self within the distant past? No Is the patient a risk to others? No Has the patient been a risk to others in the past 6 months? No Has the patient been a risk to others within the distant past? No  [place] Scale:  [place] ED from 10/14/2022 in [facility] Emergency Department at [facility] ED to Hosp-Admission (Discharged) from 06/21/2022 in [place] ED from 11/20/2021 in [facility] Emergency Department at  Max High Risk No Risk No Risk       AIMS:  , , ,  ,   ASAM:    Substance Abuse:     Past Medical History:  Past Medical History:  Diagnosis Date   Back pain, chronic    Coccygeal fracture ([place])    Cystic fibrosis    DDD (degenerative disc disease)    Ovarian cyst    Substance abuse ([place])    Thoracic compression fracture ([place])     Past Surgical History:  Procedure Laterality Date   LAPAROSCOPY     OVARIAN CYST REMOVAL     TUBAL LIGATION     Family History:  Family History  Problem Relation Age of Onset   Heart failure Mother    Diabetes Mother    Hypertension Father    Heart failure Other    Diabetes Other    Cancer Other    Family Psychiatric  History: Patient father has a history of depression per patient.  Social History:  Social History   Substance and Sexual Activity  Alcohol Use Yes   Comment: occasional     Social History   Substance and Sexual Activity  Drug Use Not Currently   Types: Morphine, Oxycodone, Hydrocodone, Marijuana   Comment: opiate    Social History   Socioeconomic History   Marital status: Divorced    Spouse name: Not on file   Number of children: Not on file   Years of education: Not on file   Highest education level: Not on file  Occupational History   Not on file  Tobacco Use   Smoking status: Every Day    Packs/day: 1.00    Years: 10.00    Additional pack years: 0.00    Total pack years: 10.00    Types: Cigarettes   Smokeless tobacco: Never  Vaping Use   Vaping Use: Never used  Substance and Sexual Activity   Alcohol use: Yes    Comment: occasional   Drug use: Not Currently    Types: Morphine, Oxycodone, Hydrocodone, Marijuana    Comment: opiate   Sexual activity: Yes    Birth control/protection: Surgical  Other Topics Concern   Not on file  Social History Narrative   Not on file   Social Determinants of Health   Financial Resource Strain: Not on file  Food  Insecurity: No Food Insecurity (06/22/2022)   Hunger Vital Sign    Worried About Running Out of Food in the Last Year: Never true    Ran Out of Food in the Last Year: Never true  Transportation Needs: No Transportation Needs (06/22/2022)   PRAPARE - Hydrologist (Medical): No    Lack of Transportation (Non-Medical): No  Physical Activity: Not on file  Stress: Not on file  Social Connections: Not on file   Additional Social History:  Allergies:   Allergies  Allergen Reactions   Compazine [Prochlorperazine] Anxiety   Phenergan [Promethazine Hcl] Anxiety    Labs:  Results for orders placed or performed during the hospital encounter of 10/14/22 (from the past 48 hour(s))  Comprehensive metabolic panel     Status: Abnormal   Collection Time: 10/14/22  4:43 AM  Result Value Ref Range   Sodium 137 135 - 145 mmol/L   Potassium 3.9 3.5 - 5.1 mmol/L   Chloride 105 98 - 111 mmol/L   CO2 18 (L) 22 - 32 mmol/L   Glucose, Bld 111 (H) 70 - 99 mg/dL    Comment: Glucose reference range applies only to samples taken after fasting for at least 8 hours.   BUN 6 6 - 20 mg/dL   Creatinine, Ser 0.51 0.44 - 1.00 mg/dL   Calcium 8.6 (L) 8.9 - 10.3 mg/dL   Total Protein 8.3 (H) 6.5 - 8.1 g/dL   Albumin 4.0 3.5 - 5.0 g/dL   AST 52 (H) 15 - 41 U/L   ALT 34 0 - 44 U/L   Alkaline Phosphatase 172 (H) 38 - 126 U/L   Total Bilirubin 0.6 0.3 - 1.2 mg/dL   GFR, Estimated >60 >60 mL/min    Comment: (NOTE) Calculated using the CKD-EPI Creatinine Equation (2021)    Anion gap 14 5 - 15    Comment: Performed at [facility] [place] [address], [place], [place] [address]  Ethanol     Status: Abnormal   Collection Time: 10/14/22  4:43 AM  Result Value Ref Range   Alcohol, Ethyl (B) 215 (H) <10 mg/dL    Comment: (NOTE) Lowest detectable limit for serum alcohol is 10 mg/dL.  For medical purposes only. Performed at [facility], [place]  [address] [place], [place] [id number]   Salicylate level     Status: Abnormal   Collection Time: 10/14/22  4:43 AM  Result Value Ref Range   Salicylate Lvl Q000111Q (L) 7.0 - 30.0 mg/dL    Comment: Performed at [facility] [place] [address], [place], [place] [address]  Acetaminophen level     Status: Abnormal   Collection Time: 10/14/22  4:43 AM  Result Value Ref Range   Acetaminophen (Tylenol), Serum <10 (L) 10 - 30 ug/mL    Comment: (NOTE) Therapeutic concentrations vary significantly. A range of 10-30 ug/mL  may be an effective concentration for many patients. However, some  are best treated at concentrations outside of this range. Acetaminophen concentrations >150 ug/mL at 4 hours after ingestion  and >50 ug/mL at 12 hours after ingestion are often associated with  toxic reactions.  Performed at [facility], [place] [address], [place], [place] [address]   cbc     Status: Abnormal   Collection Time: 10/14/22  4:43 AM  Result Value Ref Range   WBC 11.8 (H) 4.0 - 10.5 K/uL   RBC 5.05 3.87 - 5.11 MIL/uL   Hemoglobin 16.7 (H) 12.0 - 15.0 g/dL   HCT 49.1 (H) 36.0 - 46.0 %   MCV 97.2 80.0 - 100.0 fL   MCH 33.1 26.0 - 34.0 pg   MCHC 34.0 30.0 - 36.0 g/dL   RDW 13.7 11.5 - 15.5 %   Platelets 291 150 - 400 K/uL   nRBC 0.0 0.0 - 0.2 %    Comment: Performed at [facility], [place] [address] [place], [place] [address]  Rapid urine drug screen (hospital performed)  Status: None   Collection Time: 10/14/22  4:43 AM  Result Value Ref Range   Opiates NONE DETECTED NONE DETECTED   Cocaine NONE DETECTED NONE DETECTED   Benzodiazepines NONE DETECTED NONE DETECTED   Amphetamines NONE DETECTED NONE DETECTED   Tetrahydrocannabinol NONE DETECTED NONE DETECTED   Barbiturates NONE DETECTED NONE DETECTED    Comment: (NOTE) DRUG SCREEN FOR MEDICAL PURPOSES ONLY.  IF CONFIRMATION IS NEEDED FOR ANY PURPOSE, NOTIFY LAB WITHIN 5 DAYS.  LOWEST  DETECTABLE LIMITS FOR URINE DRUG SCREEN Drug Class                     Cutoff (ng/mL) Amphetamine and metabolites    1000 Barbiturate and metabolites    200 Benzodiazepine                 200 Opiates and metabolites        300 Cocaine and metabolites        300 THC                            50 Performed at [facility], [place] [address] [place], [place] [address]   Lipase, blood     Status: None   Collection Time: 10/14/22  4:43 AM  Result Value Ref Range   Lipase 46 11 - 51 U/L    Comment: Performed at [facility], [place] [address], [place], [place] [address]  I-Stat beta hCG blood, ED     Status: None   Collection Time: 10/14/22  4:57 AM  Result Value Ref Range   I-stat hCG, quantitative <5.0 <5 mIU/mL   Comment 3            Comment:   GEST. AGE      CONC.  (mIU/mL)   <=1 WEEK        5 - 50     2 WEEKS       50 - 500     3 WEEKS       100 - 10,000     4 WEEKS     1,000 - 30,000        FEMALE AND NON-PREGNANT FEMALE:     LESS THAN 5 mIU/mL     Current Facility-Administered Medications  Medication Dose Route Frequency Provider Last Rate Last Admin   hydrOXYzine (ATARAX) tablet 25 mg  25 mg Oral TID PRN Motley-Mangrum, Zara Wendt A, PMHNP       PARoxetine (PAXIL) tablet 20 mg  20 mg Oral Daily Genevive Bi F, PA-C   20 mg at 10/14/22 1026   prazosin (MINIPRESS) capsule 1 mg  1 mg Oral QHS Motley-Mangrum, Jozi Malachi A, PMHNP       Current Outpatient Medications  Medication Sig Dispense Refill   diphenhydramine-acetaminophen (TYLENOL PM) 25-500 MG TABS tablet Take 1 tablet by mouth at bedtime as needed.     Doxylamine Succinate, Sleep, (SLEEP AID PO) Take 1 tablet by mouth as needed (sleep).     PARoxetine (PAXIL) 20 MG tablet Take 20 mg by mouth daily.     acetaminophen (TYLENOL) 500 MG tablet Take 1 tablet (500 mg total) by mouth every 6 (six) hours as needed for mild pain. (Patient not taking: Reported on 06/21/2022) 30 tablet 1   folic  acid (FOLVITE) 1 MG tablet Take 1 tablet (1 mg total) by mouth daily. (Patient not taking: Reported on 10/14/2022) 30 tablet 0  HYDROcodone-acetaminophen (NORCO/VICODIN) 5-325 MG tablet Take 1 tablet by mouth every 8 (eight) hours as needed for moderate pain or severe pain. (Patient not taking: Reported on 10/14/2022) 12 tablet 0   Multiple Vitamin (MULTIVITAMIN WITH MINERALS) TABS tablet Take 1 tablet by mouth daily. (Patient not taking: Reported on 10/14/2022)     nicotine (NICODERM CQ - DOSED IN MG/24 HOURS) 14 mg/24hr patch Place 1 patch (14 mg total) onto the skin daily as needed (nicotine withdrawal). (Patient not taking: Reported on 10/14/2022) 28 patch 0   ondansetron (ZOFRAN) 4 MG tablet Take 1 tablet (4 mg total) by mouth every 8 (eight) hours as needed for nausea or vomiting. (Patient not taking: Reported on 10/14/2022) 12 tablet 0   pantoprazole (PROTONIX) 40 MG tablet Take 1 tablet (40 mg total) by mouth daily for 14 days. (Patient not taking: Reported on 10/14/2022) 14 tablet 0    Musculoskeletal: Strength & Muscle Tone: within normal limits Gait & Station: normal Patient leans: N/A   Psychiatric Specialty Exam: Presentation  General Appearance:  Disheveled  Eye Contact: Good  Speech: Pressured; Slurred  Speech Volume: Increased  Handedness: Right   Mood and Affect  Mood: Anxious; Hopeless; Depressed  Affect: Depressed; Congruent   Thought Process  Thought Processes: Coherent  Descriptions of Associations:Intact  Orientation:Full (Time, Place and Person)  Thought Content:WDL  History of Schizophrenia/Schizoaffective disorder:No data recorded Duration of Psychotic Symptoms:No data recorded Hallucinations:Hallucinations: None  Ideas of Reference:None  Suicidal Thoughts:Suicidal Thoughts: Yes, Active SI Active Intent and/or Plan: With Plan; With Means to Carry Out  Homicidal Thoughts:Homicidal Thoughts: No   Sensorium  Memory: Immediate Good;  Remote Good  Judgment: Fair  Insight: Fair   Community education officer  Concentration: Good  Attention Span: Good  Recall: Good  Fund of Knowledge: Good  Language: Good   Psychomotor Activity  Psychomotor Activity: Psychomotor Activity: Normal   Assets  Assets: Communication Skills; Housing; Social Support    Sleep  Sleep: Sleep: Poor   Physical Exam: Physical Exam Vitals and nursing note reviewed.  HENT:     Mouth/Throat:     Mouth: Mucous membranes are moist.  Musculoskeletal:        General: Normal range of motion.     Cervical back: Normal range of motion.  Neurological:     Mental Status: She is alert.  Psychiatric:        Attention and Perception: Attention normal.        Mood and Affect: Mood is anxious and depressed. Affect is tearful.        Speech: Speech normal.        Behavior: Behavior is cooperative.        Thought Content: Thought content includes suicidal ideation. Thought content includes suicidal plan.        Cognition and Memory: Cognition is impaired.        Judgment: Judgment is inappropriate.    Review of Systems  Constitutional: Negative.   HENT: Negative.    Respiratory: Negative.    Musculoskeletal: Negative.   Psychiatric/Behavioral:  Positive for depression, substance abuse and suicidal ideas. The patient is nervous/anxious and has insomnia.    Blood pressure 120/84, pulse (!) 103, temperature 98.3 F (36.8 C), resp. rate 17, SpO2 100 %. There is no height or weight on file to calculate BMI.   Medical Decision Making: Patient will be considered for inpatient Psychiatry care when Medically cleared. Will restart her Paxil 20 mg PO daily for anxiety and depression. Will start  low dose Prazosin for nightmares and terrors which aide in her insomnia, will also start low dose Atarax 25 mg PO TID PRN for anxiety.  Patient case review and discussed with Dr. Dwyane Dee. Patient needs inpatient psychiatric admission for stabilization  and treatment. TTS SW and AC will be consulted for bed placement and availability.   Problem 1: Recurrent Major Depressive disorder, severe without Psychotic features.   Problem 2: Generalized anxiety disorder  Problem 3: Nightmares and terrors and insomnia.   Disposition: Recommend psychiatric Inpatient admission when medically cleared.   Michaele Offer, PMHNP 10/14/2022 12:25 PM

## 2022-10-14 NOTE — Progress Notes (Addendum)
Inpatient Behavioral Health Placement  Pt meets inpatient criteria per Michaele Offer, PMHNP. There are no available beds within the [facility] system per Turner, RN.   Referral was sent to the following facilities;    Destination  Service Provider Address Phone Fax  [facility]  [address] [place] [address] [phone]  [place]  [address], [place] [id number] [phone]  [facility] [place]  [address] [place], [place] [address] [phone]  [place] [address] [place] [address] [phone]  [facility]  [address] [place] [address] [phone]  [place]  [place], [place] [address] [place]  [facility] [place] [address] [phone] [facility]  [place] [place] [address] [place]  [facility]  [address] [place] [address] [phone]  [facility]  [address], [place] [address] [phone]  [address], [place] [address] [phone]  [facility]  [address] [place] [address] [phone] [place]  [address], [place] [address] [phone] [place]  [address], [place] [address] [phone] [facility]  [address] [place] [address] [phone] [facility]  [address] [place] [address] [phone] [facility]  [address] [place] [address] [phone]  [facility]  [place], [place] [address] [phone]  [facility]  [address] [place] [address] [phone]   Situation ongoing,  CSW will follow up.   Benjaman Kindler, MSW, [facility] 10/14/2022  @ 11:41 PM

## 2022-10-15 NOTE — ED Notes (Signed)
Patient resting quietly in bed at this time °

## 2022-10-15 NOTE — Progress Notes (Signed)
Pt was accepted to [facility] 10/15/22; Main Campus  Pt meets inpatient criteria per Michaele Offer, [facility]  Attending Physician will be Dr Leta Jungling   Report can be called to:[phone]-Pager number, please leave a returned phone number to receive a phone call back.   Pt can arrive after 9:00am   Care Team notified: Corinna Gab, RN  Nadara Mode, [place] 10/15/2022 @ 12:58 AM

## 2022-10-15 NOTE — ED Notes (Signed)
[facility] admissions paged for report.  Awaiting call back.

## 2022-10-15 NOTE — ED Provider Notes (Signed)
Emergency Medicine Observation Re-evaluation Note  Marisa Palmer is a 42 y.o. female, seen on rounds today.  Pt initially presented to the ED for complaints of Suicide Attempt Currently, the patient is sleeping.  Physical Exam  BP 119/82 (BP Location: Right Arm)   Pulse 90   Temp 98.9 F (37.2 C) (Oral)   Resp 18   SpO2 99%  Physical Exam General: Sleeping, no distress Cardiac: Well-perfused Lungs: No increased work of breathing. Psych: Calm  ED Course / MDM  EKG:EKG Interpretation  Date/Time:  Friday October 14 2022 04:42:38 EDT Ventricular Rate:  111 PR Interval:  128 QRS Duration: 97 QT Interval:  349 QTC Calculation: 475 R Axis:   77 Text Interpretation: Sinus tachycardia Probable left atrial enlargement Baseline wander in lead(s) V1 V2 V3 V4 Confirmed by Quintella Reichert [phone]) on 10/14/2022 4:59:53 AM  I have reviewed the labs performed to date as well as medications administered while in observation.  Recent changes in the last 24 hours include inpatient psychiatric treatment.  Plan  Current plan is for placement to [facility] after 9 AM..    Ezequiel Essex, MD 10/15/22 [phone]

## 2022-10-15 NOTE — ED Notes (Signed)
Patient transferred to [facility] via [place].

## 2022-10-15 NOTE — ED Notes (Signed)
[facility] admissions again paged for report.  Awaiting callback.

## 2023-04-27 IMAGING — US US ABDOMEN LIMITED
1 series · 15 of 25 positions shown · non-contrast
Comparison: None.

CLINICAL DATA: Right upper quadrant pain

EXAM:
ULTRASOUND ABDOMEN LIMITED RIGHT UPPER QUADRANT

[Series 1: us abdomen limited mc & wl · 83 acquisitions, 15 frames shown]
[im 1/83]
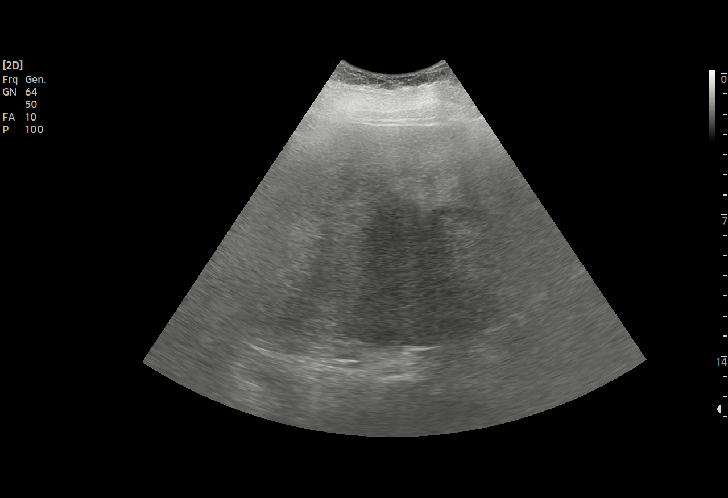
[im 7/83]
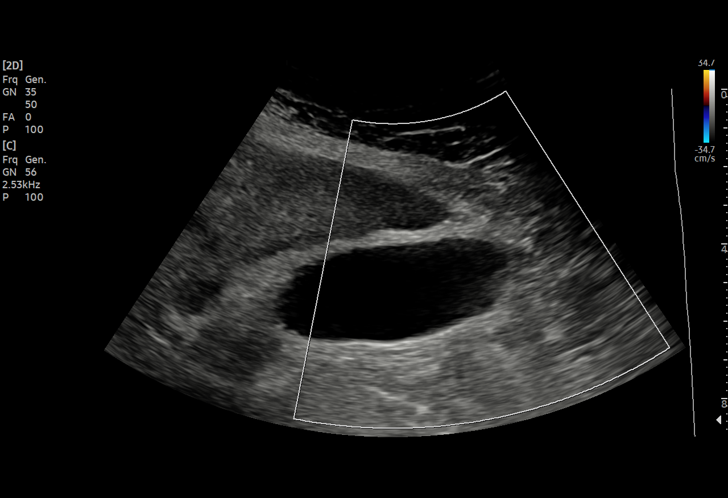
[im 14/83]
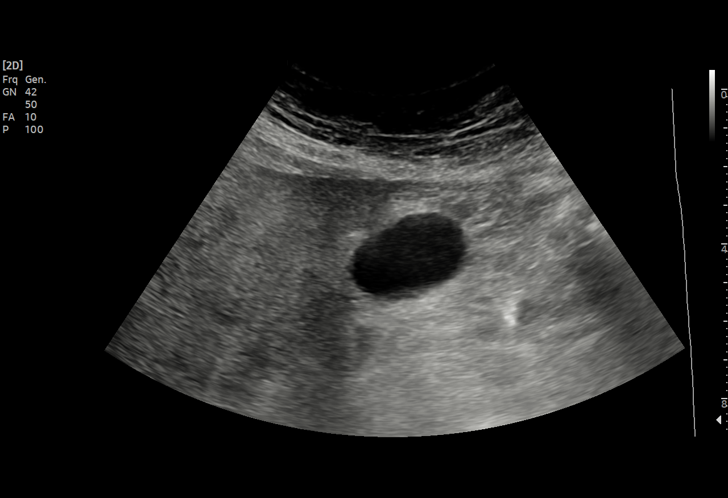
[im 18/83]
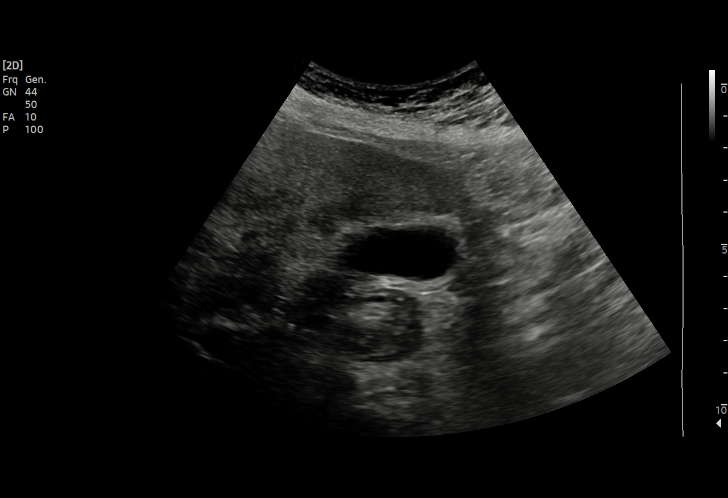
[im 24/83]
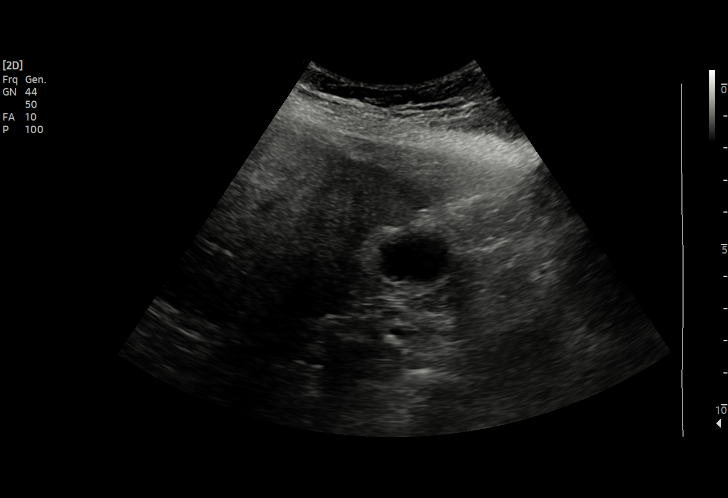
[im 31/83]
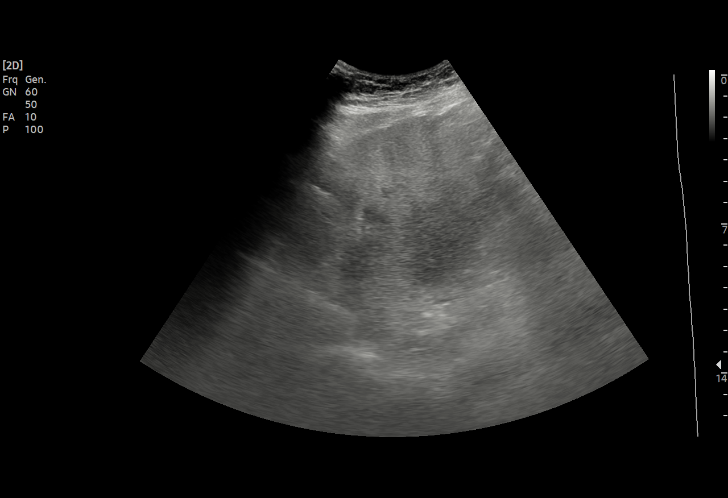
[im 35/83]
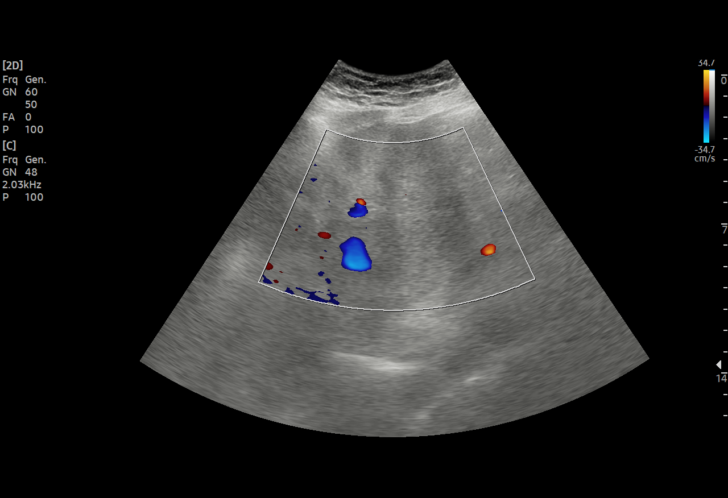
[im 42/83]
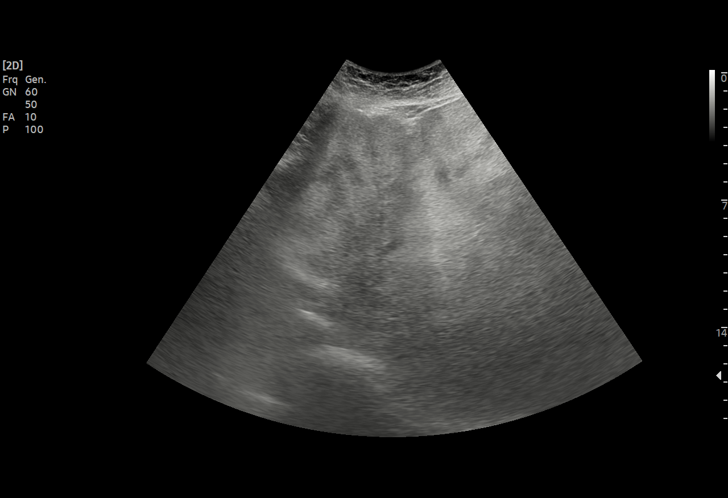
[im 48/83]
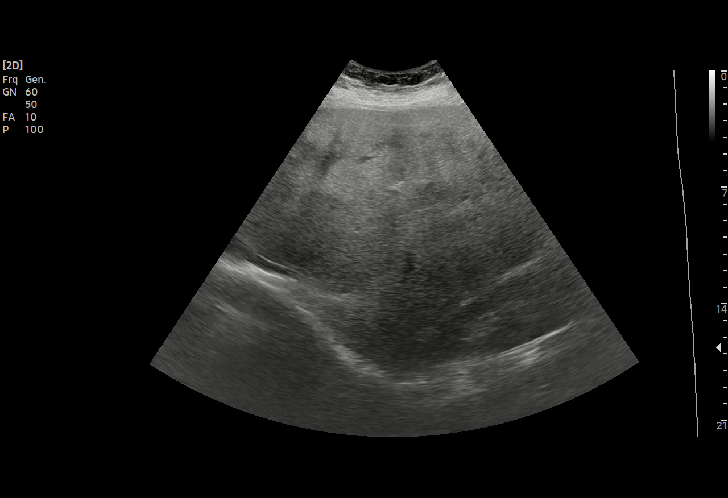
[im 52/83]
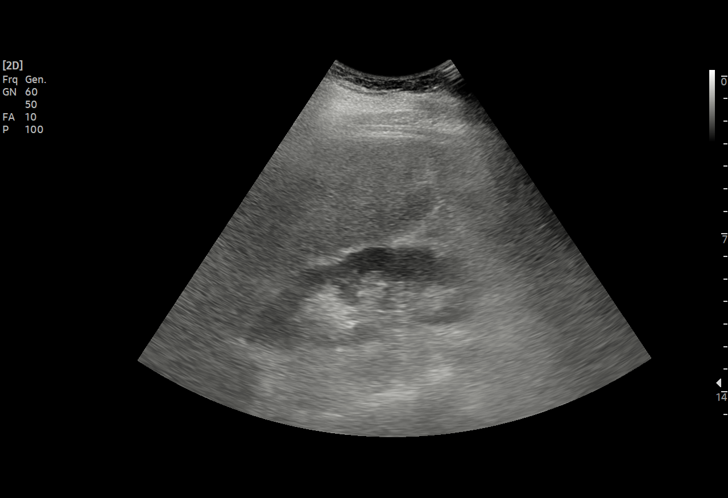
[im 59/83]
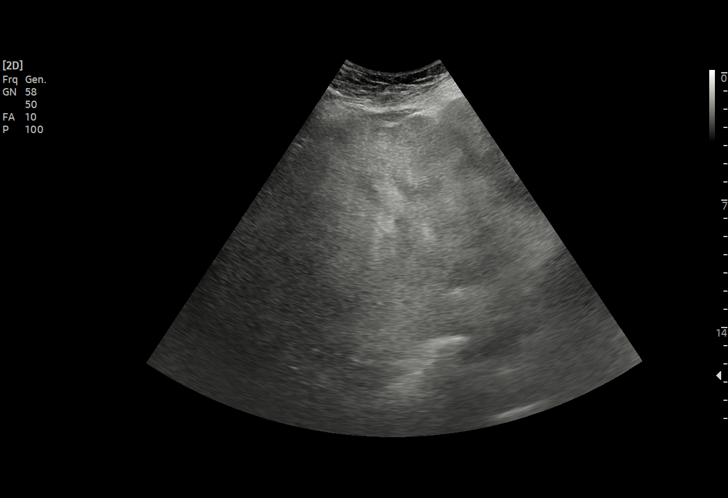
[im 65/83]
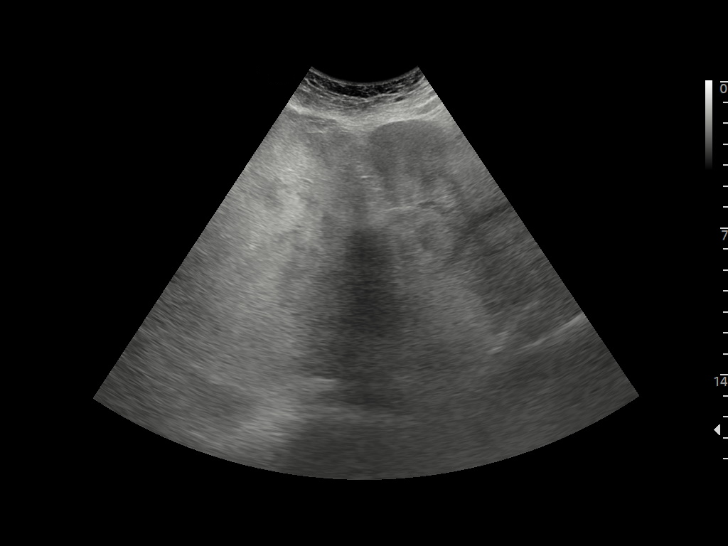
[im 69/83]
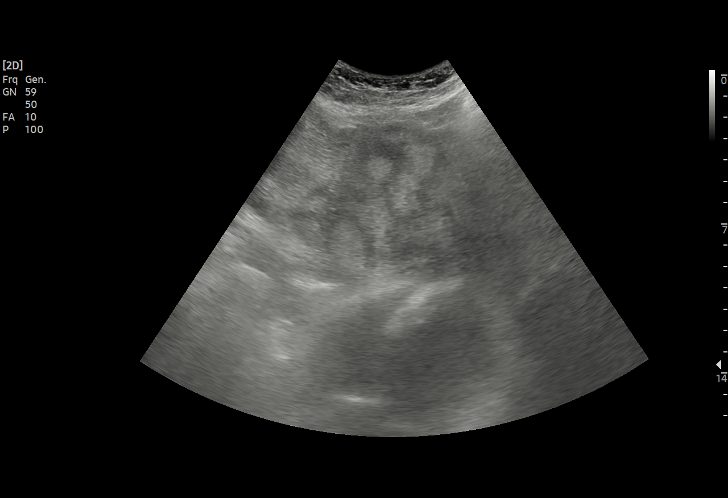
[im 76/83]
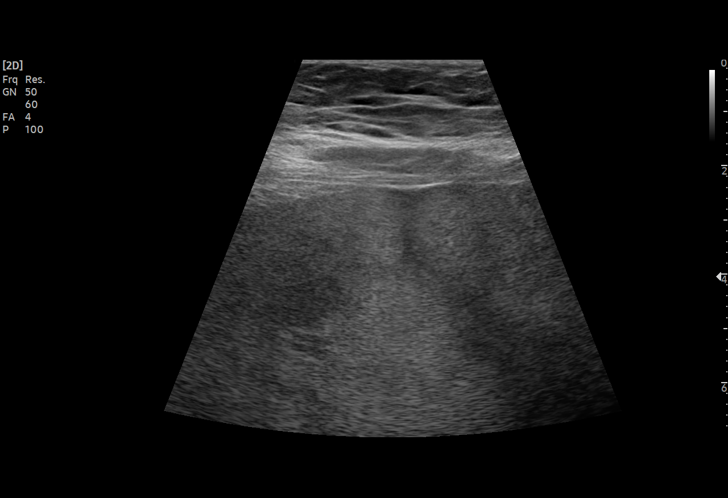
[im 83/83]
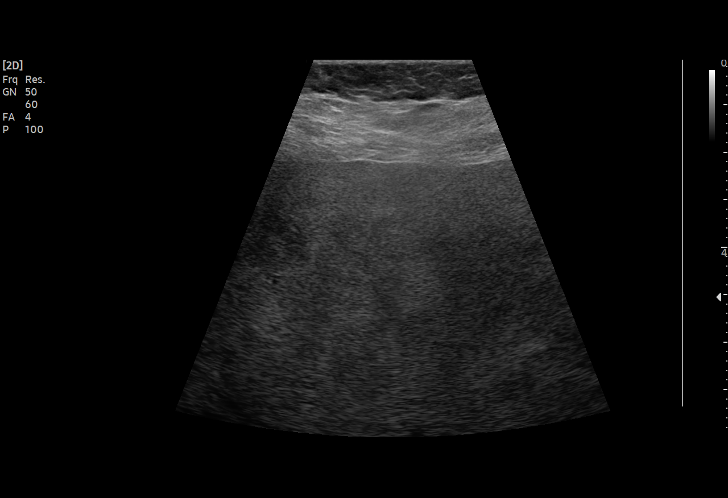

[15 of 25 positions shown; findings below may reference images not displayed]

FINDINGS: Gallbladder:

Gallbladder is well distended. Negative sonographic Murphy's sign is
noted. No wall thickening is seen.

Common bile duct:

Diameter: 5.5 mm.

Liver:

Diffusely heterogeneous without discrete mass. These changes may be
related to hepatic inflammatory change. Acoustical window is not
optimum. Portal vein is patent on color Doppler imaging with normal
direction of blood flow towards the liver.

Other: None.
IMPRESSION: Diffuse heterogeneity in the liver without focal mass although the
acoustical window with not optimal. CT with contrast is recommended
for further evaluation.

No focal abnormality in the gallbladder.

## 2023-04-27 IMAGING — CT CT ABD-PELV W/ CM
2 of 5 series · 16 of 46 positions shown, 18 images · IV contrast (omnipaque)
Comparison: Ultrasound from earlier in the same day.

CLINICAL DATA: Diffuse abdominal pain

EXAM:
CT ABDOMEN AND PELVIS WITH CONTRAST
TECHNIQUE: Multidetector CT imaging of the abdomen and pelvis was performed
using the standard protocol following bolus administration of
intravenous contrast.
CONTRAST:  100mL OMNIPAQUE IOHEXOL 300 MG/ML  SOLN

[Series 2: axial st · axial · 0.79mm/px · z∈[+1070,+1464]mm · 13 of 93 slices shown, 15 images]
[im 7/93  soft-tissue]
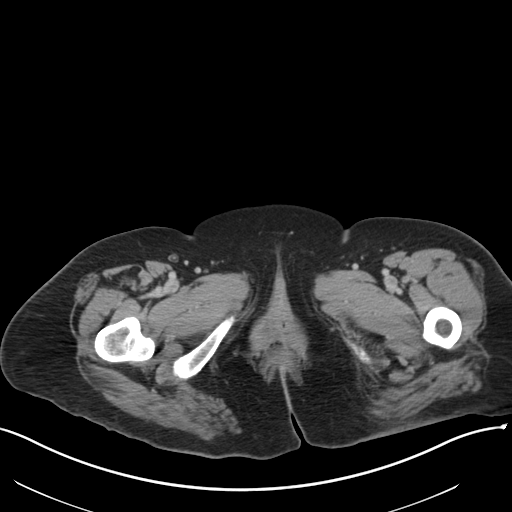
[im 7/93  bone]
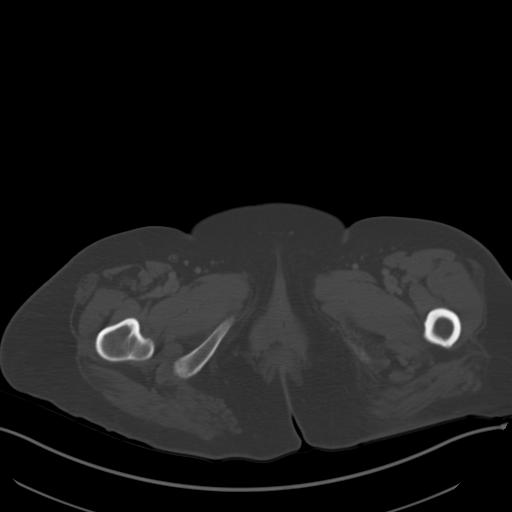
[im 14/93  soft-tissue]
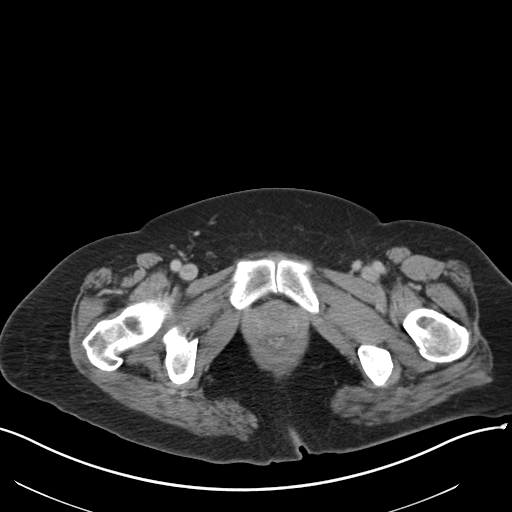
[im 20/93  soft-tissue]
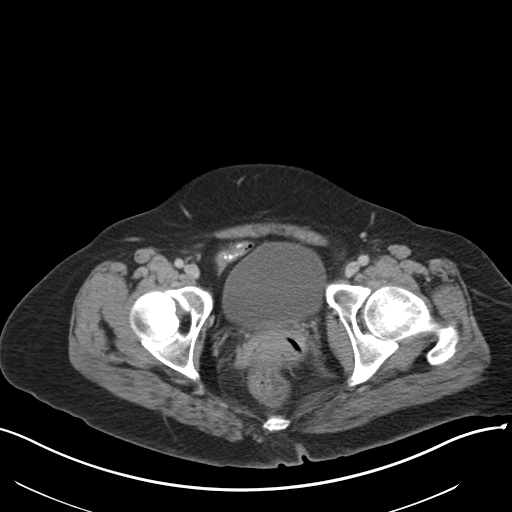
[im 27/93  soft-tissue]
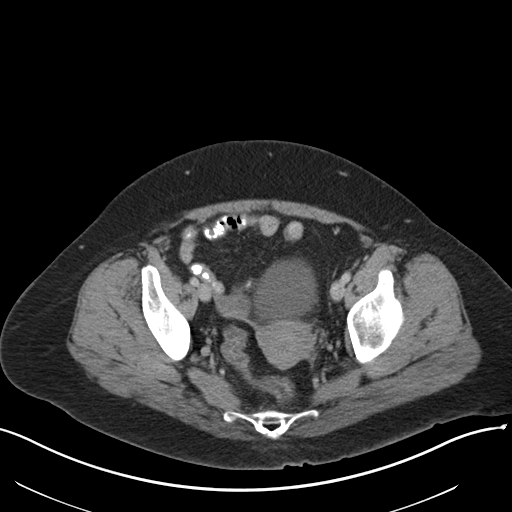
[im 33/93  soft-tissue]
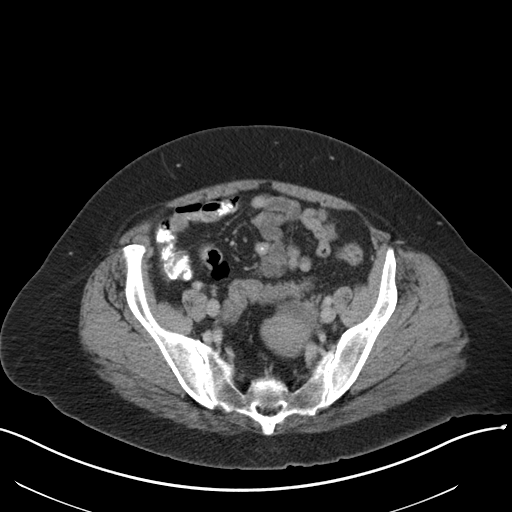
[im 40/93  soft-tissue]
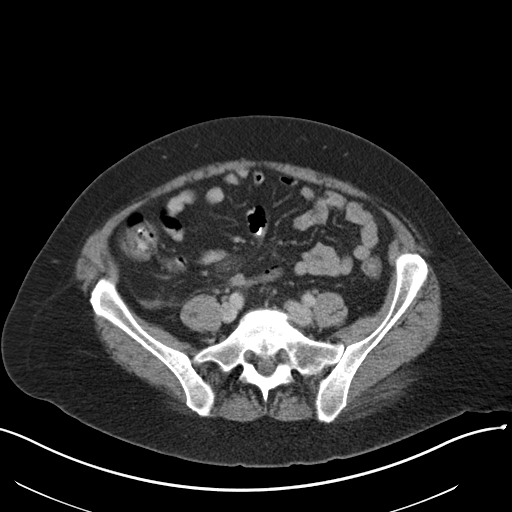
[im 47/93  soft-tissue]
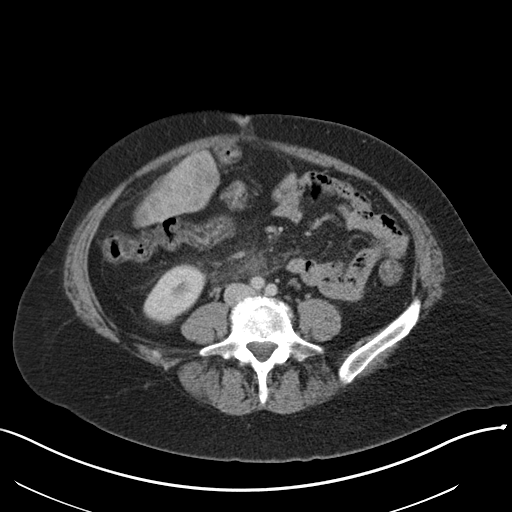
[im 53/93  soft-tissue]
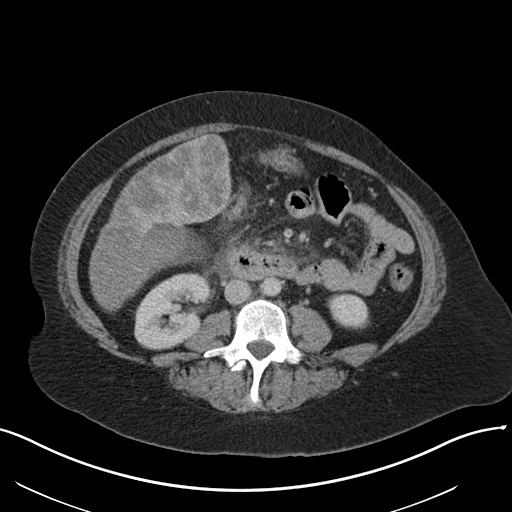
[im 60/93  soft-tissue]
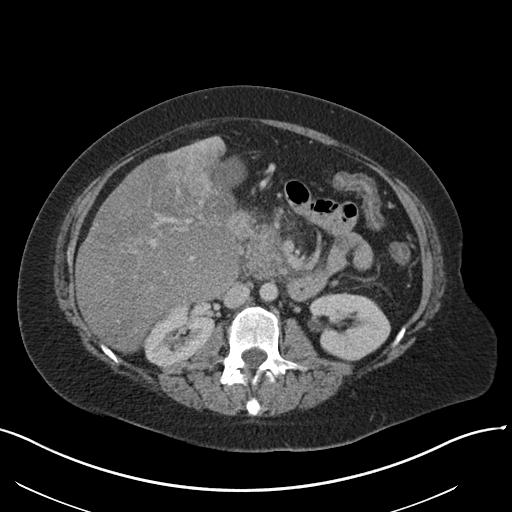
[im 60/93  bone]
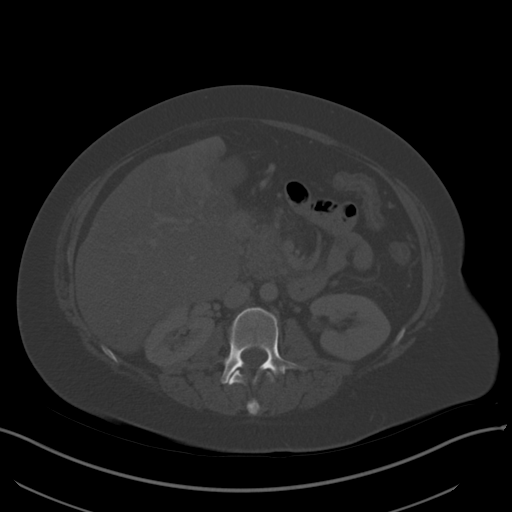
[im 66/93  soft-tissue]
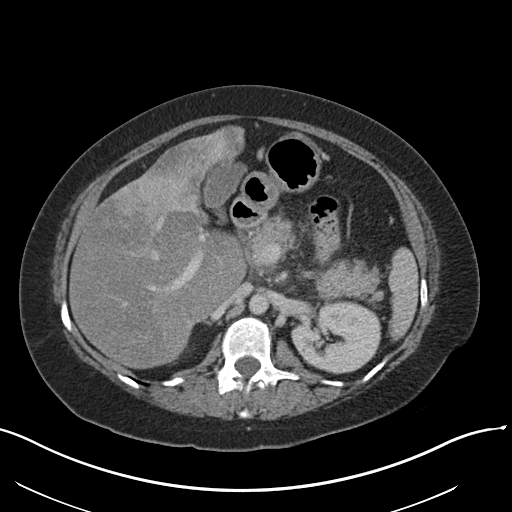
[im 73/93  soft-tissue]
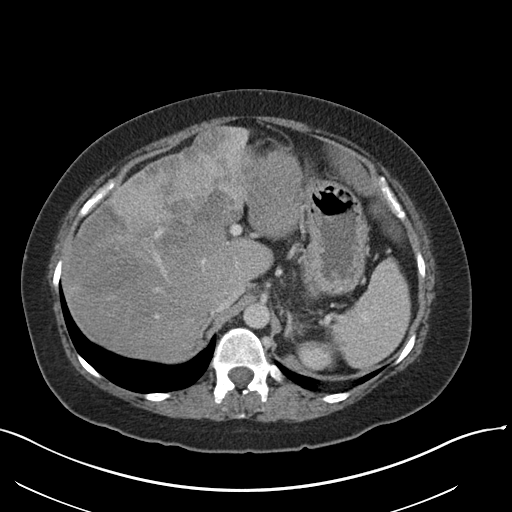
[im 79/93  soft-tissue]
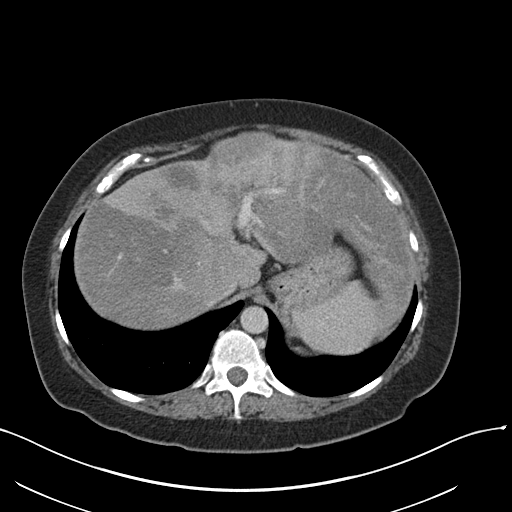
[im 86/93  soft-tissue]
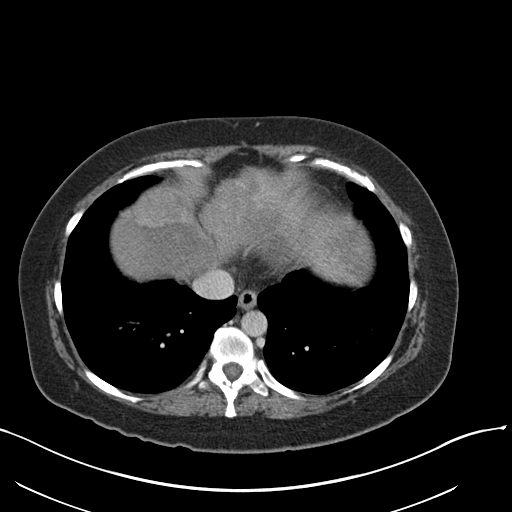

[Series 4: coronal st · coronal · 0.88mm/px · 3 of 151 slices shown]
[im 51/151  soft-tissue]
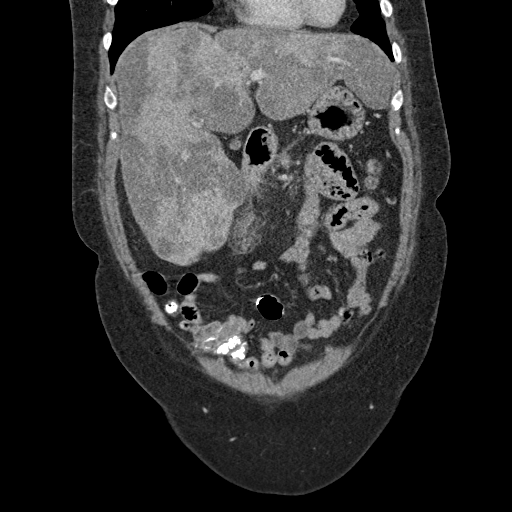
[im 67/151  soft-tissue]
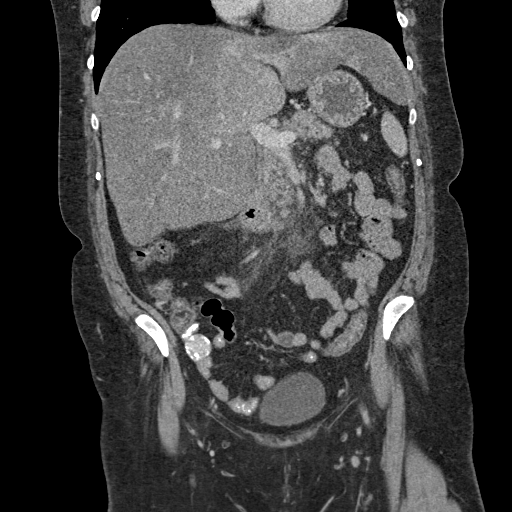
[im 84/151  soft-tissue]
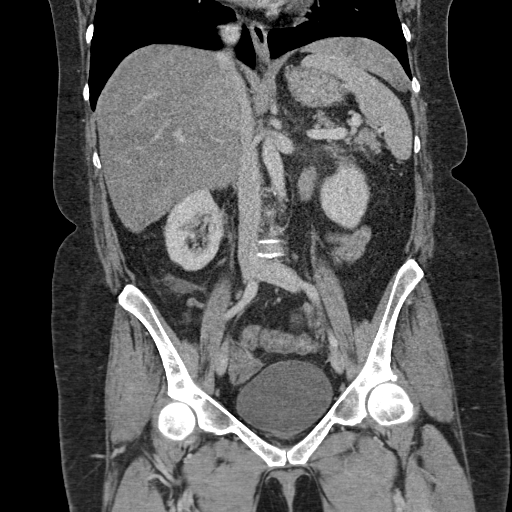

[16 of 46 positions shown; findings below may reference images not displayed]

FINDINGS: Lower chest: No acute abnormality.

Hepatobiliary: Liver demonstrates geographic areas of decreased
attenuation with interposed areas of increased attenuation
throughout the liver. Some of the areas of increased attenuation
follow the portal vein branches and may be related to periportal
fibrosis. This appearance is similar to that seen on the prior
ultrasound. There is some nodularity identified particularly in the
left lobe of the liver along the falciform ligament. No adequate
comparison exam is available. No biliary ductal dilatation is seen.
The common bile duct and gallbladder appear within normal limits.

Pancreas: Pancreas demonstrates peripancreatic inflammatory change
in the region of the head and uncinate process which extends along
the anterior and inferior aspect of Gerota's fascia on the right
consistent with acute pancreatitis. The body and tail appear within
normal limits.

Spleen: Normal in size without focal abnormality.

Adrenals/Urinary Tract: Adrenal glands are within normal limits.
Kidneys are well visualized and within normal limits. No renal
calculi are seen. Bladder is well distended.

Stomach/Bowel: Colon is predominately decompressed. No obstructive
or inflammatory changes are seen. The appendix is within normal
limits. Small bowel and stomach are unremarkable.

Vascular/Lymphatic: No significant vascular findings are present. No
enlarged abdominal or pelvic lymph nodes. No evidence of variceal
change is seen.

Reproductive: Uterus and bilateral adnexa are unremarkable.

Other: No abdominal wall hernia or abnormality. No abdominopelvic
ascites.

Musculoskeletal: No acute or significant osseous findings.
IMPRESSION: Findings consistent with acute pancreatitis in the region of the
head and uncinate process.

Constellation of findings within the liver consistent with
geographic areas of fatty infiltration and some more nodular areas
which could be related to underlying cirrhotic change secondary to
the patient's known history of cystic fibrosis. Nonemergent contrast
enhanced MRI is recommended when the patient's clinical condition
improves to allow for better breath hold and optimum imaging for
better delineation of the overall disease process.

No other focal abnormality is noted.

## 2023-06-25 ENCOUNTER — Other Ambulatory Visit (HOSPITAL_COMMUNITY)
Admission: EM | Admit: 2023-06-25 | Discharge: 2023-06-29 | Disposition: A | Discharge status: Home / Self Care | Payer: MEDICAID | Attending: Addiction Medicine | Admitting: Addiction Medicine

## 2023-06-25 ENCOUNTER — Ambulatory Visit (HOSPITAL_COMMUNITY)
Admission: EM | Admit: 2023-06-25 | Discharge: 2023-06-25 | Disposition: A | Discharge status: Nursing Home (Includes ICF) | Payer: MEDICAID | Attending: Behavioral Health | Admitting: Behavioral Health

## 2023-06-25 DIAGNOSIS — F1191 Opioid use, unspecified, in remission: Secondary | ICD-10-CM | POA: Insufficient documentation

## 2023-06-25 DIAGNOSIS — F17210 Nicotine dependence, cigarettes, uncomplicated: Secondary | ICD-10-CM | POA: Diagnosis not present

## 2023-06-25 DIAGNOSIS — F419 Anxiety disorder, unspecified: Secondary | ICD-10-CM | POA: Insufficient documentation

## 2023-06-25 DIAGNOSIS — F1020 Alcohol dependence, uncomplicated: Secondary | ICD-10-CM | POA: Diagnosis present

## 2023-06-25 DIAGNOSIS — F4310 Post-traumatic stress disorder, unspecified: Secondary | ICD-10-CM | POA: Insufficient documentation

## 2023-06-25 DIAGNOSIS — F10230 Alcohol dependence with withdrawal, uncomplicated: Secondary | ICD-10-CM | POA: Diagnosis not present

## 2023-06-25 DIAGNOSIS — F331 Major depressive disorder, recurrent, moderate: Secondary | ICD-10-CM | POA: Diagnosis not present

## 2023-06-25 DIAGNOSIS — F1110 Opioid abuse, uncomplicated: Secondary | ICD-10-CM | POA: Insufficient documentation

## 2023-06-25 DIAGNOSIS — Y906 Blood alcohol level of 120-199 mg/100 ml: Secondary | ICD-10-CM | POA: Insufficient documentation

## 2023-06-25 DIAGNOSIS — F32A Depression, unspecified: Secondary | ICD-10-CM | POA: Insufficient documentation

## 2023-06-25 DIAGNOSIS — Z653 Problems related to other legal circumstances: Secondary | ICD-10-CM | POA: Insufficient documentation

## 2023-06-25 LAB — POCT URINE DRUG SCREEN - MANUAL ENTRY (I-SCREEN)
POC Amphetamine UR: NOT DETECTED
POC Buprenorphine (BUP): NOT DETECTED
POC Cocaine UR: NOT DETECTED
POC Marijuana UR: NOT DETECTED
POC Methadone UR: NOT DETECTED
POC Methamphetamine UR: NOT DETECTED
POC Morphine: NOT DETECTED
POC Oxazepam (BZO): NOT DETECTED
POC Oxycodone UR: NOT DETECTED
POC Secobarbital (BAR): NOT DETECTED

## 2023-06-25 NOTE — ED Provider Notes (Signed)
Behavioral [facility] Urgent Care Medical Screening Exam  Patient Name: [name] MRN: [id number] Date of Evaluation: 06/26/23 Chief Complaint:   Diagnosis:  Final diagnoses:  Alcohol use disorder, severe, dependence (HCC)    History of Present illness: Marisa Palmer is a 42 y.o. female with psychiatric history of anxiety, depression, opiate abuse, alcohol abuse, and PTSD.  Patient presented voluntarily to [facility] requesting alcohol detox.  Patient was assessed face-to-face and her chart was reviewed by this nurse practitioner.  On assessment, patientis alert and oriented. Patient is speaking in normal tone, moderate and volume with good eye contact. Her mood is anxious and depressed, affect is congruent with mood. Her thought process is coherent. No objective sign of psychosis, mania, or delusional thought content.   Patient reports long standing history of alcohol abuse.  Patient states that she started drinking alcohol at the age of 80.  She says she has participated in substance abuse programs in the past and was able to stop using opiate however she continues to struggle with alcohol abuse. She reports that she was sober for "a few months" but relapsed 3 months ago due to stress of her daughter and grand kids  moving in with her. She reports that she drinks approximately 15 bottles of Four Lokos/bottlegers daily. She says she typically experiences alcohol withdrawal symptoms of increased anxiety, palpitation, chills, N/V within 5-6 hours of abstaining from alcohol consumption. She denies history of alcohol withdraw seizure or DTs. she denies SI/HI/AVH. She denies active use of all other substance expect for alcohol. She says she last consumed alcohol 1-2 hours prior to this assessment. BAL on admission is 188. UDS on this admission is negative.     Flowsheet Row ED from 06/25/2023 in [facility] Most recent reading at 06/25/2023 11:56 PM ED from 06/25/2023 in [facility] Most recent reading at 06/25/2023  9:52 PM ED from 10/14/2022 in [facility] Emergency Department at [facility] Most recent reading at 10/14/2022  5:39 AM  C-SSRS RISK CATEGORY No Risk No Risk High Risk       Psychiatric Specialty Exam  Presentation  General Appearance:Appropriate for Environment  Eye Contact:Good  Speech:Clear and Coherent  Speech Volume:Normal  Handedness:Right   Mood and Affect  Mood: Anxious; Depressed  Affect: Tearful   Thought Process  Thought Processes: Coherent  Descriptions of Associations:Intact  Orientation:Full (Time, Place and Person)  Thought Content:WDL    Hallucinations:None  Ideas of Reference:None  Suicidal Thoughts:No With Plan; With Means to Carry Out  Homicidal Thoughts:No   Sensorium  Memory: Immediate Good; Recent Good; Remote Good  Judgment: Good  Insight: Good   Executive Functions  Concentration: Good  Attention Span: Good  Recall: Good  Fund of Knowledge: Good  Language: Good   Psychomotor Activity  Psychomotor Activity: Normal   Assets  Assets: Communication Skills; Desire for Improvement; Physical Health   Sleep  Sleep: Fair  Number of hours:  5   Physical Exam: Physical Exam Vitals and nursing note reviewed.  Constitutional:      General: She is not in acute distress.    Appearance: She is well-developed.  HENT:     Head: Normocephalic and atraumatic.  Eyes:     Conjunctiva/sclera: Conjunctivae normal.  Cardiovascular:     Rate and Rhythm: Normal rate.  Pulmonary:     Effort: Pulmonary effort is normal. No respiratory distress.  Abdominal:     Palpations: Abdomen is soft.  Tenderness: There is no abdominal tenderness.  Musculoskeletal:        General: No swelling.     Cervical back: Normal range of motion.  Skin:    General: Skin is warm and dry.  Neurological:     Mental Status: She is alert and oriented to  person, place, and time.  Psychiatric:        Attention and Perception: Attention and perception normal.        Mood and Affect: Mood is anxious and depressed. Affect is tearful.        Speech: Speech normal.        Behavior: Behavior normal. Behavior is cooperative.        Thought Content: Thought content normal.        Cognition and Memory: Cognition normal.    Review of Systems  Constitutional: Negative.   HENT: Negative.    Eyes: Negative.   Respiratory: Negative.    Cardiovascular: Negative.   Gastrointestinal: Negative.   Genitourinary: Negative.   Musculoskeletal: Negative.   Skin: Negative.   Neurological: Negative.   Endo/Heme/Allergies: Negative.   Psychiatric/Behavioral:  Positive for depression and substance abuse. The patient is nervous/anxious.    Blood pressure (!) 136/100, pulse 97, temperature 98.2 F (36.8 C), temperature source Oral, resp. rate 18, SpO2 98%. There is no height or weight on file to calculate BMI.  Musculoskeletal: Strength & Muscle Tone: within normal limits Gait & Station: normal Patient leans: Right   [facility] MSE Discharge Disposition for Follow up and Recommendations: Patient will be admitted to [facility] for alcohol detox ans substance abuse treatment.  -CIWA intiated  Initiate CIWA protocol -lorazepam 1 mg every 6 hours prn for CIWA >10 -thiamine 100 mg daily for nutritional supplementation -hydroxyzine 25 mg every 6 hours prn for anxiety, CIWA < or = 10 -ondansetron 4 mg ODT every 6 hours prn nausea/vomiting -loperamide 2-4 mg capsule prn diarrhea or loose stools   Konner Warrior A Daizy Outen, NP 06/26/2023, 3:53 AM

## 2023-06-25 NOTE — ED Notes (Signed)
Patient admitted to [facility] through [facility] for alcohol detox. On arrival, pt A/O X4. MAE. Denies SI/HI /AVH but complaining of feeling anxious and needing that help. Pt cooperative throughout the admission process. Oriented to the unit and room. Offered some snacks. Reassured and patient agreed to the treatment agreement and ready to be sober and get her life back to normal. Will monitor for safety.

## 2023-06-26 ENCOUNTER — Encounter (HOSPITAL_COMMUNITY): Payer: Self-pay | Admitting: Urology

## 2023-06-26 DIAGNOSIS — F17210 Nicotine dependence, cigarettes, uncomplicated: Secondary | ICD-10-CM | POA: Diagnosis not present

## 2023-06-26 DIAGNOSIS — F331 Major depressive disorder, recurrent, moderate: Secondary | ICD-10-CM | POA: Diagnosis not present

## 2023-06-26 DIAGNOSIS — F1020 Alcohol dependence, uncomplicated: Secondary | ICD-10-CM | POA: Diagnosis present

## 2023-06-26 DIAGNOSIS — F419 Anxiety disorder, unspecified: Secondary | ICD-10-CM | POA: Diagnosis not present

## 2023-06-26 DIAGNOSIS — F10230 Alcohol dependence with withdrawal, uncomplicated: Secondary | ICD-10-CM | POA: Diagnosis not present

## 2023-06-26 LAB — COMPREHENSIVE METABOLIC PANEL
ALT: 62 U/L — ABNORMAL HIGH (ref 0–44)
AST: 65 U/L — ABNORMAL HIGH (ref 15–41)
Albumin: 3.9 g/dL (ref 3.5–5.0)
Alkaline Phosphatase: 186 U/L — ABNORMAL HIGH (ref 38–126)
Anion gap: 12 (ref 5–15)
BUN: 8 mg/dL (ref 6–20)
CO2: 26 mmol/L (ref 22–32)
Calcium: 9.8 mg/dL (ref 8.9–10.3)
Chloride: 104 mmol/L (ref 98–111)
Creatinine, Ser: 0.62 mg/dL (ref 0.44–1.00)
GFR, Estimated: >60 mL/min (ref >60)
Glucose, Bld: 121 mg/dL — ABNORMAL HIGH (ref 70–99)
Potassium: 4.2 mmol/L (ref 3.5–5.1)
Sodium: 142 mmol/L (ref 135–145)
Total Bilirubin: 0.5 mg/dL (ref <1.2)
Total Protein: 7.8 g/dL (ref 6.5–8.1)

## 2023-06-26 LAB — CBC WITH DIFFERENTIAL/PLATELET
Abs Immature Granulocytes: 0.04 10*3/uL (ref 0.00–0.07)
Basophils Absolute: 0 10*3/uL (ref 0.0–0.1)
Basophils Relative: 0 %
Eosinophils Absolute: 0.1 10*3/uL (ref 0.0–0.5)
Eosinophils Relative: 1 %
HCT: 44.7 % (ref 36.0–46.0)
Hemoglobin: 15.1 g/dL — ABNORMAL HIGH (ref 12.0–15.0)
Immature Granulocytes: 0 %
Lymphocytes Relative: 54 %
Lymphs Abs: 5 10*3/uL — ABNORMAL HIGH (ref 0.7–4.0)
MCH: 33.2 pg (ref 26.0–34.0)
MCHC: 33.8 g/dL (ref 30.0–36.0)
MCV: 98.2 fL (ref 80.0–100.0)
Monocytes Absolute: 0.5 10*3/uL (ref 0.1–1.0)
Monocytes Relative: 5 %
Neutro Abs: 3.7 10*3/uL (ref 1.7–7.7)
Neutrophils Relative %: 40 %
Platelets: 282 10*3/uL (ref 150–400)
RBC: 4.55 MIL/uL (ref 3.87–5.11)
RDW: 12.8 % (ref 11.5–15.5)
WBC: 9.3 10*3/uL (ref 4.0–10.5)
nRBC: 0 % (ref 0.0–0.2)

## 2023-06-26 LAB — PREGNANCY, URINE: Preg Test, Ur: NEGATIVE

## 2023-06-26 LAB — ETHANOL: Alcohol, Ethyl (B): 188 mg/dL — ABNORMAL HIGH (ref <10)

## 2023-06-26 LAB — LIPID PANEL
Cholesterol: 224 mg/dL — ABNORMAL HIGH (ref 0–200)
HDL: 49 mg/dL (ref >40)
LDL Cholesterol: UNDETERMINED mg/dL (ref 0–99)
Total CHOL/HDL Ratio: 4.6 {ratio}
Triglycerides: 564 mg/dL — ABNORMAL HIGH (ref <150)
VLDL: UNDETERMINED mg/dL (ref 0–40)

## 2023-06-26 LAB — HEMOGLOBIN A1C
Hgb A1c MFr Bld: 5.1 % (ref 4.8–5.6)
Mean Plasma Glucose: 99.67 mg/dL

## 2023-06-26 LAB — TSH: TSH: 3.07 u[IU]/mL (ref 0.350–4.500)

## 2023-06-26 MED ORDER — THIAMINE HCL 100 MG/ML IJ SOLN
100.0000 mg | Freq: Once | INTRAMUSCULAR | Status: AC
  Administered 2023-06-26: 100 mg via INTRAMUSCULAR
  Filled 2023-06-26: qty 2

## 2023-06-26 MED ORDER — LORAZEPAM 1 MG PO TABS
1.0000 mg | ORAL_TABLET | Freq: Every day | ORAL | Status: AC
  Administered 2023-06-29: 1 mg via ORAL
  Filled 2023-06-26: qty 1

## 2023-06-26 MED ORDER — TRAZODONE HCL 50 MG PO TABS
50.0000 mg | ORAL_TABLET | Freq: Every evening | ORAL | Status: DC | PRN
  Administered 2023-06-26 – 2023-06-28 (×4): 50 mg via ORAL
  Filled 2023-06-26 (×3): qty 1
  Filled 2023-06-26: qty 14
  Filled 2023-06-26: qty 1

## 2023-06-26 MED ORDER — ACETAMINOPHEN 325 MG PO TABS
650.0000 mg | ORAL_TABLET | Freq: Four times a day (QID) | ORAL | Status: DC | PRN
  Administered 2023-06-26: 650 mg via ORAL
  Filled 2023-06-26: qty 2

## 2023-06-26 MED ORDER — ONDANSETRON 4 MG PO TBDP
4.0000 mg | ORAL_TABLET | Freq: Four times a day (QID) | ORAL | Status: AC | PRN
Start: 2023-06-26 — End: 2023-06-29

## 2023-06-26 MED ORDER — MAGNESIUM HYDROXIDE 400 MG/5ML PO SUSP: 30.0000 mL | Freq: Every day | ORAL | Status: DC | PRN

## 2023-06-26 MED ORDER — HYDROXYZINE HCL 25 MG PO TABS
25.0000 mg | ORAL_TABLET | Freq: Four times a day (QID) | ORAL | Status: AC | PRN
  Administered 2023-06-26 – 2023-06-28 (×6): 25 mg via ORAL
  Filled 2023-06-26 (×2): qty 1
  Filled 2023-06-26: qty 15
  Filled 2023-06-26 (×4): qty 1

## 2023-06-26 MED ORDER — LORAZEPAM 1 MG PO TABS
1.0000 mg | ORAL_TABLET | Freq: Four times a day (QID) | ORAL | Status: AC
  Administered 2023-06-26 (×4): 1 mg via ORAL
  Filled 2023-06-26 (×4): qty 1

## 2023-06-26 MED ORDER — LORAZEPAM 1 MG PO TABS
1.0000 mg | ORAL_TABLET | Freq: Two times a day (BID) | ORAL | Status: AC
  Administered 2023-06-28 (×2): 1 mg via ORAL
  Filled 2023-06-26 (×2): qty 1

## 2023-06-26 MED ORDER — LOPERAMIDE HCL 2 MG PO CAPS: 2.0000 mg | ORAL_CAPSULE | ORAL | Status: AC | PRN

## 2023-06-26 MED ORDER — LORAZEPAM 1 MG PO TABS
1.0000 mg | ORAL_TABLET | Freq: Four times a day (QID) | ORAL | Status: AC | PRN
  Administered 2023-06-26 – 2023-06-27 (×2): 1 mg via ORAL
  Filled 2023-06-26 (×2): qty 1

## 2023-06-26 MED ORDER — ADULT MULTIVITAMIN W/MINERALS CH
1.0000 | ORAL_TABLET | Freq: Every day | ORAL | Status: DC
  Administered 2023-06-26 – 2023-06-29 (×4): 1 via ORAL
  Filled 2023-06-26 (×4): qty 1

## 2023-06-26 MED ORDER — LORAZEPAM 1 MG PO TABS
1.0000 mg | ORAL_TABLET | Freq: Three times a day (TID) | ORAL | Status: AC
  Administered 2023-06-27 (×3): 1 mg via ORAL
  Filled 2023-06-26 (×3): qty 1

## 2023-06-26 MED ORDER — ALUM & MAG HYDROXIDE-SIMETH 200-200-20 MG/5ML PO SUSP: 30.0000 mL | ORAL | Status: DC | PRN

## 2023-06-26 MED ORDER — NICOTINE 14 MG/24HR TD PT24
14.0000 mg | MEDICATED_PATCH | Freq: Every day | TRANSDERMAL | Status: DC
  Administered 2023-06-26 – 2023-06-29 (×4): 14 mg via TRANSDERMAL
  Filled 2023-06-26 (×3): qty 1
  Filled 2023-06-26: qty 14
  Filled 2023-06-26: qty 1

## 2023-06-26 MED ORDER — THIAMINE MONONITRATE 100 MG PO TABS
100.0000 mg | ORAL_TABLET | Freq: Every day | ORAL | Status: DC
  Administered 2023-06-27 – 2023-06-29 (×3): 100 mg via ORAL
  Filled 2023-06-26 (×3): qty 1

## 2023-06-26 NOTE — ED Notes (Signed)
MHT gave pt a pillow and blanket.

## 2023-06-26 NOTE — ED Notes (Signed)
Patient woke up with anxiety stating I cannot breathe and its getting worse. Assessment was done and vital signs checked and stable. Patient reassured, medicated and made comfortable in bed. Patient being monitored closely.

## 2023-06-26 NOTE — Group Note (Signed)
Group Topic: Communication  Group Date: 06/26/2023 Start Time: 1930 End Time: 2012 Facilitators: Rae Lips B  Department: [facility]  Number of Participants: 4  Group Focus: activities of daily living skills, check in, clarity of thought, and personal responsibility Treatment Modality:  Exposure Therapy Interventions utilized were story telling and support Purpose: express feelings  Name: DEAISA HOSAKA Date of Birth: [birth year]  MR: [id number]    Level of Participation: active Quality of Participation: attentive and cooperative Interactions with others: gave feedback Mood/Affect: appropriate, brightens with interaction, and positive Triggers (if applicable): NA Cognition: coherent/clear and goal directed Progress: Gaining insight Response: York Spaniel its here first day here and she's adjusting because its different.  Plan: patient will be encouraged to keep going to groups.   Patients Problems:  Patient Active Problem List   Diagnosis Date Noted   Alcohol use disorder, severe, dependence (HCC) 06/26/2023   Insomnia 10/14/2022   Alcoholic pancreatitis 06/21/2022   Elevated TSH 02/27/2021   Acute pancreatitis 02/25/2021   Acute alcoholic pancreatitis 01/15/2021   ETOH abuse 01/15/2021   Generalized anxiety disorder 12/17/2019   Major depressive disorder, recurrent episode (HCC) 12/17/2019

## 2023-06-26 NOTE — ED Notes (Signed)
Patient is sleeping. Respirations equal and unlabored, skin warm and dry. No change in assessment or acuity. Routine safety checks conducted according to facility protocol. Will continue to monitor for safety.   

## 2023-06-26 NOTE — BH Assessment (Signed)
Comprehensive Clinical Assessment (CCA) Note   06/26/2023 AMYA BAUDIN [id number]  Disposition: Cecilio Asper, NP recommends [facility] for detox treatment.   The patient demonstrates the following risk factors for suicide: Chronic risk factors for suicide include: substance use disorder. Acute risk factors for suicide include: family or marital conflict. Protective factors for this patient include: positive social support. Considering these factors, the overall suicide risk at this point appears to be low. Patient is not appropriate for outpatient follow up.   Pt is 42 yo female who presents to [facility] voluntarily accompanied by her best friend. Pt reports that she is seeking alcohol detox treatment. Pt reports that she is drinking to maintain so that she does not experience withdrawals. Pt reports that she has hx of tremors, shortness of breath. Pt reports drinking 5-6 Malt liquor drinks. Reports increased anxiety. Denies SI,HI,AVH. Pt reports that her last drink was 2 hours ago. Pt denies outpatient services. Pt is experiencing increased stressed, anxiety, and depression due to having to taking care of her grandkids.   On evaluation, patient is alert, oriented x 4, and cooperative. Speech is clear, coherent and logical. Pt appears casual. Eye contact is fair. Mood is anxious and depressed, affect is congruent with mood. Thought process is logical and thought content is coherent. Pt denies SI/HI/AVH. There is no indication that the patient is responding to internal stimuli. No delusions elicited during this assessment.      Chief Complaint: Detox  Visit Diagnosis:   Alcohol Use Disorder     CCA Screening, Triage and Referral (STR)  Patient Reported Information How did you hear about [place]? Self  What Is the Reason for Your Visit/Call Today? Pt is 42 yo female who presents to [facility] voluntarily accompanied by her best friend. Pt reports that she is seeking alcohol detox treatment. Pt reports that she is  drinking to maintain so that she does not experience withdrawals. Pt reports that she has hx of tremors, shortness of breath. Pt reports drinking 5-6 Malt liquor drinks. Reports increased anxiety. Denies SI,HI,AVH. Pt reports that her last drink was 2 hours ago. Pt denies outpatient services. Pt is experiencing increased stressed, anxiety, and depression due to having to taking care of her grandkids.  How Long Has This Been Causing You Problems? 1-6 months  What Do You Feel Would Help You the Most Today? Treatment for Depression or other mood problem; Stress Management; Medication(s); Alcohol or Drug Use Treatment   Have You Recently Had Any Thoughts About Hurting Yourself? No  Are You Planning to Commit Suicide/Harm Yourself At This time? No   Flowsheet Row ED from 06/25/2023 in [facility] Most recent reading at 06/25/2023 11:56 PM ED from 06/25/2023 in [facility] Most recent reading at 06/25/2023  9:52 PM ED from 10/14/2022 in [facility] Emergency Department at [facility] Most recent reading at 10/14/2022  5:39 AM  C-SSRS RISK CATEGORY No Risk No Risk High Risk       Have you Recently Had Thoughts About Hurting Someone Karolee Ohs? No  Are You Planning to Harm Someone at This Time? No  Explanation: Denies HI   Have You Used Any Alcohol or Drugs in the Past 24 Hours? Yes  What Did You Use and How Much? 5-6 Malt Liquor drinks   Do You Currently Have a Therapist/Psychiatrist? No Name of Therapist/Psychiatrist: Name of Therapist/Psychiatrist: Denies   Have You Been Recently Discharged From Any Office Practice or Programs? No  Explanation of Discharge From Practice/Program: N/A     CCA Screening Triage Referral Assessment Type of Contact: Face-to-Face  Telemedicine Service Delivery:   Is this Initial or Reassessment?   Date Telepsych consult ordered in [facility]:    Time Telepsych consult ordered in [facility]:    Location of  Assessment: [facility] Assessment Services  Provider Location: [facility] Assessment Services   Collateral Involvement: None   Does Patient Have a Automotive engineer Guardian? No  Legal Guardian Contact Information: N/A  Copy of Legal Guardianship Form: N/A Legal Guardian Notified of Arrival: N/A Legal Guardian Notified of Pending Discharge: N/A If Minor and Not Living with Parent(s), Who has Custody? N/A Is CPS involved or ever been involved? N/A Is APS involved or ever been involved? N/A  Patient Determined To Be At Risk for Harm To Self or Others Based on Review of Patient Reported Information or Presenting Complaint? NO Method: N/A Availability of Means: N/A Intent: N/A Notification Required: N/A Additional Information for Danger to Others Potential: N/A Additional Comments for Danger to Others Potential: None  Are There Guns or Other Weapons in Your Home? No Types of Guns/Weapons: N/A Are These Weapons Safely Secured?                            Pt denies access to weapons/ guns  Who Could Verify You Are Able To Have These Secured:N/A Do You Have any Outstanding Charges, Pending Court Dates, Parole/Probation? No pending legal charges Contacted To Inform of Risk of Harm To Self or Others: N/A  Does Patient Present under Involuntary Commitment?No  [place] of Residence: [place]  Patient Currently Receiving the Following Services: None  Determination of Need: Urgent (48 hours)   Options For Referral: Facility-Based Crisis     CCA Biopsychosocial Patient Reported Schizophrenia/Schizoaffective Diagnosis in Past: No   Strengths: Pt willing to seek treatment   Mental Health Symptoms Depression:   Change in energy/activity; Increase/decrease in appetite; Sleep (too much or little); Hopelessness   Duration of Depressive symptoms:  Duration of Depressive Symptoms: Greater than two weeks   Mania:   None   Anxiety:    Worrying; Sleep; Restlessness   Psychosis:   None  Duration of Psychotic symptoms:  N/A  Trauma:   None   Obsessions:   None   Compulsions:   None   Inattention:   None   Hyperactivity/Impulsivity:   None   Oppositional/Defiant Behaviors:   None   Emotional Irregularity:   None   Other Mood/Personality Symptoms:   None    Mental Status Exam Appearance and self-care  Stature:   Average   Weight:   Average weight   Clothing:   Disheveled   Grooming:   Neglected   Cosmetic use:   None   Posture/gait:   Normal   Motor activity:   Not Remarkable   Sensorium  Attention:   Normal   Concentration:   Normal   Orientation:   Time; Situation; Place; Person   Recall/memory:   Normal   Affect and Mood  Affect:   Anxious   Mood:   Anxious   Relating  Eye contact:   Normal   Facial expression:   Anxious   Attitude toward examiner:   Cooperative   Thought and Language  Speech flow:  Clear and Coherent   Thought content:   Appropriate to Mood and Circumstances   Preoccupation:   None   Hallucinations:  None   Organization:   Coherent   [employer] of Knowledge:   Average   Intelligence:   Average   Abstraction:   Normal   Judgement:   Impaired   Reality Testing:   Realistic   Insight:   Good   Decision Making:   Impulsive   Social Functioning  Social Maturity:   Impulsive   Social Judgement:   Victimized   Stress  Stressors:   Family conflict; Transitions   Coping Ability:   Exhausted; Overwhelmed   Skill Deficits:   Communication; Self-care   Supports:   Friends/Service system     Religion: Religion/Spirituality Are You A Religious Person?: No How Might This Affect Treatment?: N/A  Leisure/Recreation: Leisure / Recreation Do You Have Hobbies?: No  Exercise/Diet: Exercise/Diet Do You Exercise?: No Have You Gained or Lost A Significant Amount of Weight in the Past Six Months?: No Do You Follow a Special Diet?:  No Do You Have Any Trouble Sleeping?: No   CCA Employment/Education Employment/Work Situation: Employment / Work Situation Employment Situation: Unemployed Patient's Job has Been Impacted by Current Illness: No  Education: Education Is Patient Currently Attending School?: No Last Grade Completed: 12 Did You Product manager?: No Did You Have An Individualized Education Program (IIEP): No Did You Have Any Difficulty At [employer]?: No Patient's Education Has Been Impacted by Current Illness: No   CCA Family/Childhood History Family and Relationship History: Family history Marital status: Single Does patient have children?: Yes How many children?: 1 How is patient's relationship with their children?: Pt reports living with her daughter and it being a stressful living situation.  Childhood History:  Childhood History By whom was/is the patient raised?: Mother Did patient suffer any verbal/emotional/physical/sexual abuse as a child?: No Did patient suffer from severe childhood neglect?: No Has patient ever been sexually abused/assaulted/raped as an adolescent or adult?: No Was the patient ever a victim of a crime or a disaster?: No Witnessed domestic violence?: No Has patient been affected by domestic violence as an adult?: No       CCA Substance Use Alcohol/Drug Use: Alcohol / Drug Use Pain Medications: See MAR Prescriptions: See MAR Over the Counter: See MAR History of alcohol / drug use?: Yes Longest period of sobriety (when/how long): Per chart, a long time ago Negative Consequences of Use: Work / Programmer, multimedia, Surveyor, quantity Withdrawal Symptoms: Irritability, Weakness, Tremors Substance #1 Name of Substance 1: ETOH 1 - Age of First Use: UTA 1 - Amount (size/oz): 5-6 Malt Liquor Bootleggers Drinks 1 - Frequency: Daily 1 - Duration: UTA 1 - Last Use / Amount: 06/25/23 1 - Method of Aquiring: UTA 1- Route of Use: UTA                       ASAM's:  Six Dimensions  of Multidimensional Assessment  Dimension 1:  Acute Intoxication and/or Withdrawal Potential:      Dimension 2:  Biomedical Conditions and Complications:      Dimension 3:  Emotional, Behavioral, or Cognitive Conditions and Complications:     Dimension 4:  Readiness to Change:     Dimension 5:  Relapse, Continued use, or Continued Problem Potential:     Dimension 6:  Recovery/Living Environment:     ASAM Severity Score:    ASAM Recommended Level of Treatment: ASAM Recommended Level of Treatment: Level II Partial Hospitalization Treatment   Substance use Disorder (SUD) Substance Use Disorder (SUD)  Checklist Symptoms of Substance Use:  Continued use despite having a persistent/recurrent physical/psychological problem caused/exacerbated by use, Continued use despite persistent or recurrent social, interpersonal problems, caused or exacerbated by use  Recommendations for Services/Supports/Treatments: Recommendations for Services/Supports/Treatments Recommendations For Services/Supports/Treatments: Facility Based Crisis  Discharge Disposition:    DSM5 Diagnoses: Patient Active Problem List   Diagnosis Date Noted   Alcohol use disorder, severe, dependence (HCC) 06/26/2023   Insomnia 10/14/2022   Alcoholic pancreatitis 06/21/2022   Elevated TSH 02/27/2021   Acute pancreatitis 02/25/2021   Acute alcoholic pancreatitis 01/15/2021   ETOH abuse 01/15/2021   Generalized anxiety disorder 12/17/2019   Major depressive disorder, recurrent episode (HCC) 12/17/2019     Referrals to Alternative Service(s): Referred to Alternative Service(s):   Place:   Date:   Time:    Referred to Alternative Service(s):   Place:   Date:   Time:    Referred to Alternative Service(s):   Place:   Date:   Time:    Referred to Alternative Service(s):   Place:   Date:   Time:     Dava Najjar, [place], [employer] [facility], NCC

## 2023-06-26 NOTE — ED Provider Notes (Signed)
Facility Based Crisis Admission H&P  Date: 06/26/23 Patient Name: Marisa Palmer MRN: [id number] Chief Complaint: alcohol detox  Diagnoses:  Final diagnoses:  Alcohol dependence with uncomplicated withdrawal [facility])    HPI: Marisa Palmer is a 42 y.o. female with psychiatric history of anxiety, depression, opiate abuse, alcohol abuse, and PTSD.  Patient presented to [facility] requesting alcohol detox.   Patient seen in in her room, no acute distress. Patient reports feeling "not so good" today. Patient reports fair sleep and fair appetite. Regarding withdrawal symptoms, she reports high anxiety and diaphoresis. Regarding cravings, she reports cravings to tobacco. Patient denies current SI, HI, and AVH. Regarding discharge plans, she requests to go to a residential facility. Patient does have an upcoming court date on December 12th. She is open to CDIOP if she is unable to go to a residential facility.   Patient reports long standing history of alcohol abuse.  Patient states that she started drinking alcohol at the age of 34.  She says she has participated in substance abuse programs in the past and was able to stop using opiate however she continues to struggle with alcohol abuse. She reports that she was sober for "a few months" but relapsed 3 months ago due to stress of her daughter and grand kids  moving in with her. She reports that she drinks approximately 15 bottles of Four Lokos/bottlegers daily. She says she typically experiences alcohol withdrawal symptoms of increased anxiety, palpitation, chills, N/V within 5-6 hours of abstaining from alcohol consumption. She denies history of alcohol withdraw seizure or DTs.   PHQ 2-9:  Flowsheet Row ED from 06/25/2023 in [facility]  Thoughts that you would be better off dead, or of hurting yourself in some way Not at all  PHQ-9 Total Score 15       Flowsheet Row ED from 06/25/2023 in [facility] Most  recent reading at 06/25/2023 11:56 PM ED from 06/25/2023 in [facility] Most recent reading at 06/25/2023  9:52 PM ED from 10/14/2022 in [facility] Emergency Department at [facility] Most recent reading at 10/14/2022  5:39 AM  C-SSRS RISK CATEGORY No Risk No Risk High Risk       Screenings    Flowsheet Row Most Recent Value  CIWA-Ar Total 11       Total Time spent with patient: 30 minutes  Musculoskeletal  Strength & Muscle Tone: within normal limits Gait & Station: normal Patient leans: N/A  Psychiatric Specialty Exam  Presentation General Appearance:  Disheveled  Eye Contact: Fair  Speech: Clear and Coherent  Speech Volume: Normal  Handedness: Right   Mood and Affect  Mood: Anxious; Depressed  Affect: Congruent   Thought Process  Thought Processes: Coherent; Goal Directed; Linear  Descriptions of Associations:Intact  Orientation:Full (Time, Place and Person)  Thought Content:Logical; WDL  Diagnosis of Schizophrenia or Schizoaffective disorder in past: No   Hallucinations:Hallucinations: None  Ideas of Reference:None  Suicidal Thoughts:Suicidal Thoughts: No  Homicidal Thoughts:Homicidal Thoughts: No   Sensorium  Memory: Remote Good  Judgment: Fair  Insight: Fair   Art therapist  Concentration: Good  Attention Span: Good  Recall: Good  Fund of Knowledge: Good  Language: Good   Psychomotor Activity  Psychomotor Activity: Psychomotor Activity: Normal   Assets  Assets: Resilience   Sleep  Sleep: Sleep: Fair Number of Hours of Sleep: 5   Nutritional Assessment (For OBS and FBC admissions only) Has the patient had  a weight loss or gain of 10 pounds or more in the last 3 months?: No Has the patient had a decrease in food intake/or appetite?: No Does the patient have dental problems?: No Does the patient have eating habits or behaviors that may be indicators of an  eating disorder including binging or inducing vomiting?: No Has the patient recently lost weight without trying?: 0 Has the patient been eating poorly because of a decreased appetite?: 0 Malnutrition Screening Tool Score: 0    Physical Exam Vitals reviewed.  Constitutional:      Appearance: Normal appearance. She is diaphoretic.  HENT:     Head: Normocephalic and atraumatic.  Cardiovascular:     Rate and Rhythm: Normal rate.  Pulmonary:     Effort: Pulmonary effort is normal.  Neurological:     Mental Status: She is alert.    Review of Systems  Constitutional:  Negative for chills and fever.  Cardiovascular:  Negative for chest pain and palpitations.  Gastrointestinal:  Negative for nausea and vomiting.  Neurological:  Negative for headaches.  Psychiatric/Behavioral:  The patient is nervous/anxious.     Blood pressure 101/64, pulse 93, resp. rate 20, SpO2 98%. There is no height or weight on file to calculate BMI.  Past Psychiatric History: MDD, anxiety, alcohol use  Past Medical History: scoliosis Substance History:  Smoking: 20 pack year history Alcohol:   Onset: 42 years old  Amount and frequency: 15 bottles of four lokos daily  Last use: Yesterday  Hx of withdrawal symptoms: experiences alcohol withdrawal symptoms of increased anxiety, palpitation, chills, N/V within 5-6 hours of abstaining from alcohol consumption; denies history of alcohol withdraw seizure or DTs    Periods of sobriety: yes, sober for a few months but relapsed 3 months ago Illicit drugs: previously used opioids Rehab: Yes, was able to stop opioid use   Is the patient at risk to self? No  Has the patient been a risk to self in the past 6 months? No .    Has the patient been a risk to self within the distant past? No   Is the patient a risk to others? No   Has the patient been a risk to others in the past 6 months? No   Has the patient been a risk to others within the distant past? No   Last  Labs:  Admission on 06/25/2023, Discharged on 06/25/2023  Component Date Value Ref Range Status   WBC 06/25/2023 9.3  4.0 - 10.5 K/uL Final   RBC 06/25/2023 4.55  3.87 - 5.11 MIL/uL Final   Hemoglobin 06/25/2023 15.1 (H)  12.0 - 15.0 g/dL Final   HCT 51/88/4166 44.7  36.0 - 46.0 % Final   MCV 06/25/2023 98.2  80.0 - 100.0 fL Final   MCH 06/25/2023 33.2  26.0 - 34.0 pg Final   MCHC 06/25/2023 33.8  30.0 - 36.0 g/dL Final   RDW 01/22/1600 12.8  11.5 - 15.5 % Final   Platelets 06/25/2023 282  150 - 400 K/uL Final   nRBC 06/25/2023 0.0  0.0 - 0.2 % Final   Neutrophils Relative % 06/25/2023 40  % Final   Neutro Abs 06/25/2023 3.7  1.7 - 7.7 K/uL Final   Lymphocytes Relative 06/25/2023 54  % Final   Lymphs Abs 06/25/2023 5.0 (H)  0.7 - 4.0 K/uL Final   Monocytes Relative 06/25/2023 5  % Final   Monocytes Absolute 06/25/2023 0.5  0.1 - 1.0 K/uL Final   Eosinophils Relative  06/25/2023 1  % Final   Eosinophils Absolute 06/25/2023 0.1  0.0 - 0.5 K/uL Final   Basophils Relative 06/25/2023 0  % Final   Basophils Absolute 06/25/2023 0.0  0.0 - 0.1 K/uL Final   Immature Granulocytes 06/25/2023 0  % Final   Abs Immature Granulocytes 06/25/2023 0.04  0.00 - 0.07 K/uL Final   Performed at [facility], [address], [place], [place] [address]   Sodium 06/25/2023 142  135 - 145 mmol/L Final   Potassium 06/25/2023 4.2  3.5 - 5.1 mmol/L Final   Chloride 06/25/2023 104  98 - 111 mmol/L Final   CO2 06/25/2023 26  22 - 32 mmol/L Final   Glucose, Bld 06/25/2023 121 (H)  70 - 99 mg/dL Final   Glucose reference range applies only to samples taken after fasting for at least 8 hours.   BUN 06/25/2023 8  6 - 20 mg/dL Final   Creatinine, Ser 06/25/2023 0.62  0.44 - 1.00 mg/dL Final   Calcium 08/65/7846 9.8  8.9 - 10.3 mg/dL Final   Total Protein 96/29/5284 7.8  6.5 - 8.1 g/dL Final   Albumin 13/24/4010 3.9  3.5 - 5.0 g/dL Final   AST 27/25/3664 65 (H)  15 - 41 U/L Final   ALT 06/25/2023 62 (H)  0 -  44 U/L Final   Alkaline Phosphatase 06/25/2023 186 (H)  38 - 126 U/L Final   Total Bilirubin 06/25/2023 0.5  <1.2 mg/dL Final   GFR, Estimated 06/25/2023 >60  >60 mL/min Final   Comment: (NOTE) Calculated using the CKD-EPI Creatinine Equation (2021)    Anion gap 06/25/2023 12  5 - 15 Final   Performed at [facility], [address] [place], [place] [address]   Hgb A1c MFr Bld 06/25/2023 5.1  4.8 - 5.6 % Final   Comment: (NOTE) Pre diabetes:          5.7%-6.4%  Diabetes:              >6.4%  Glycemic control for   <7.0% adults with diabetes    Mean Plasma Glucose 06/25/2023 99.67  mg/dL Final   Performed at [facility], [address] [place], [place] [address]   Alcohol, Ethyl (B) 06/25/2023 188 (H)  <10 mg/dL Final   Comment: (NOTE) Lowest detectable limit for serum alcohol is 10 mg/dL.  For medical purposes only. Performed at [facility], [address], [place], [place] [address]    POC Amphetamine UR 06/25/2023 None Detected  NONE DETECTED (Cut Off Level 1000 ng/mL) Final   POC Secobarbital (BAR) 06/25/2023 None Detected  NONE DETECTED (Cut Off Level 300 ng/mL) Final   POC Buprenorphine (BUP) 06/25/2023 None Detected  NONE DETECTED (Cut Off Level 10 ng/mL) Final   POC Oxazepam (BZO) 06/25/2023 None Detected  NONE DETECTED (Cut Off Level 300 ng/mL) Final   POC Cocaine UR 06/25/2023 None Detected  NONE DETECTED (Cut Off Level 300 ng/mL) Final   POC Methamphetamine UR 06/25/2023 None Detected  NONE DETECTED (Cut Off Level 1000 ng/mL) Final   POC Morphine 06/25/2023 None Detected  NONE DETECTED (Cut Off Level 300 ng/mL) Final   POC Methadone UR 06/25/2023 None Detected  NONE DETECTED (Cut Off Level 300 ng/mL) Final   POC Oxycodone UR 06/25/2023 None Detected  NONE DETECTED (Cut Off Level 100 ng/mL) Final   POC Marijuana UR 06/25/2023 None Detected  NONE DETECTED (Cut Off Level 50 ng/mL) Final   Preg  Test, Ur 06/25/2023 NEGATIVE  NEGATIVE Final   Comment:         THE SENSITIVITY OF THIS METHODOLOGY IS >25 mIU/mL. Performed at [facility], [address], [place], [place] [address]    Cholesterol 06/25/2023 224 (H)  0 - 200 mg/dL Final   Triglycerides 78/46/9629 564 (H)  <150 mg/dL Final   HDL 52/84/1324 49  >40 mg/dL Final   Total CHOL/HDL Ratio 06/25/2023 4.6  RATIO Final   VLDL 06/25/2023 UNABLE TO CALCULATE IF TRIGLYCERIDE OVER 400 mg/dL  0 - 40 mg/dL Final   LDL Cholesterol 06/25/2023 UNABLE TO CALCULATE IF TRIGLYCERIDE OVER 400 mg/dL  0 - 99 mg/dL Final   Comment:        Total Cholesterol/HDL:CHD Risk Coronary Heart Disease Risk Table                     Men   Women  1/2 Average Risk   3.4   3.3  Average Risk       5.0   4.4  2 X Average Risk   9.6   7.1  3 X Average Risk  23.4   11.0        Use the calculated Patient Ratio above and the CHD Risk Table to determine the patient's CHD Risk.        ATP III CLASSIFICATION (LDL):  <100     mg/dL   Optimal  401-027  mg/dL   Near or Above                    Optimal  130-159  mg/dL   Borderline  253-664  mg/dL   High  >403     mg/dL   Very High Performed at [facility], [address] [place], [place] [address]    TSH 06/25/2023 3.070  0.350 - 4.500 uIU/mL Final   Comment: Performed by a 3rd Generation assay with a functional sensitivity of <=0.01 uIU/mL. Performed at [facility], [address], [place], [place] [address]     Allergies: Compazine [prochlorperazine] and Phenergan [promethazine hcl]  Medications:  Facility Ordered Medications  Medication   acetaminophen (TYLENOL) tablet 650 mg   alum & mag hydroxide-simeth (MAALOX/MYLANTA) 200-200-20 MG/5ML suspension 30 mL   magnesium hydroxide (MILK OF MAGNESIA) suspension 30 mL   traZODone (DESYREL) tablet 50 mg   [COMPLETED] thiamine (VITAMIN B1) injection 100 mg   [START ON 06/27/2023] thiamine (VITAMIN B1) tablet 100 mg   multivitamin with minerals tablet 1 tablet   LORazepam (ATIVAN) tablet 1 mg    hydrOXYzine (ATARAX) tablet 25 mg   loperamide (IMODIUM) capsule 2-4 mg   ondansetron (ZOFRAN-ODT) disintegrating tablet 4 mg   LORazepam (ATIVAN) tablet 1 mg   Followed by   Melene Muller ON 06/27/2023] LORazepam (ATIVAN) tablet 1 mg   Followed by   Melene Muller ON 06/28/2023] LORazepam (ATIVAN) tablet 1 mg   Followed by   Melene Muller ON 06/29/2023] LORazepam (ATIVAN) tablet 1 mg   nicotine (NICODERM CQ - dosed in mg/24 hours) patch 14 mg   PTA Medications  Medication Sig   PARoxetine (PAXIL) 20 MG tablet Take 20 mg by mouth daily.   Doxylamine Succinate, Sleep, (SLEEP AID PO) Take 1 tablet by mouth as needed (sleep).   diphenhydramine-acetaminophen (TYLENOL PM) 25-500 MG TABS tablet Take 1 tablet by mouth at bedtime as needed.    Long Term Goals: Improvement in symptoms so as ready for discharge  Short Term Goals: Patient will verbalize feelings in meetings with treatment team members., Patient will attend at least of 50% of the groups daily., Pt will complete the PHQ9 on admission, day 3 and discharge., Patient will participate in completing the [place] Suicide Severity Rating Scale, and Patient will score a low risk of violence for 24 hours prior to discharge  Medical Decision Making  Alcohol Use Disorder -Ativan taper -CIWA with Ativan as needed for CIWA greater than 10  -Last CIWA score is 11 on 12/2 at 5 AM -Thiamine 100 mg IM first day and PO after that -Multivitamin with minerals daily -Tylenol 650 mg every 6 hours as needed for pain -Zofran 4 mg every 6 hours as needed for nausea or vomiting -Imodium 2 to 4 mg as needed for diarrhea or loose stools  -Maalox/Mylanta 30 mL every 4 hours as needed for indigestion -Milk of Mag 30 mL as needed for constipation  Tobacco Use Disorder -Nicotine patch ordered    Recommendations  Based on my evaluation the patient does not appear to have an emergency medical condition.  Lance Muss, MD 06/26/23  10:39 AM

## 2023-06-26 NOTE — ED Notes (Signed)
Pt is in his room resting in bed. Pt denies SI/HI/AVH. No acute distress noted. Will continue to monitor for safety.

## 2023-06-26 NOTE — ED Notes (Signed)
Patient in the bedroom calm and sleeping. NAD Respirations even and unlabored. Will continue monitor for safety

## 2023-06-26 NOTE — Group Note (Signed)
Group Topic: Change and Accountability AA Group Meeting Group Date: 06/26/2023 Start Time: 1009 End Time: 1110 Facilitators: Vonzell Schlatter B  Department: [facility]  Number of Participants: 6  Group Focus: community group and daily focus Treatment Modality:  Psychoeducation Interventions utilized were story telling and support Purpose: enhance coping skills and reinforce self-care  Name: Marisa Palmer Date of Birth: March 01, 1981  MR: [id number]    Level of Participation: moderate Quality of Participation: attentive and cooperative Interactions with others: gave feedback Mood/Affect: positive Triggers (if applicable): n/a Cognition: coherent/clear Progress: Moderate Response: n/a Plan: follow-up needed pt left out near the end of the group statement was made I don't have an alcohol problem  Patients Problems:  Patient Active Problem List   Diagnosis Date Noted   Alcohol use disorder, severe, dependence (HCC) 06/26/2023   Insomnia 10/14/2022   Alcoholic pancreatitis 06/21/2022   Elevated TSH 02/27/2021   Acute pancreatitis 02/25/2021   Acute alcoholic pancreatitis 01/15/2021   ETOH abuse 01/15/2021   Generalized anxiety disorder 12/17/2019   Major depressive disorder, recurrent episode (HCC) 12/17/2019

## 2023-06-26 NOTE — Group Note (Signed)
Group Topic: Wellness  Group Date: 06/26/2023 Start Time: 1700 End Time: 1730 Facilitators: Dickie La, RN  Department: [facility]  Number of Participants: 7  Group Focus: relapse prevention and self-awareness Treatment Modality:  Psychoeducation Interventions utilized were patient education, problem solving, and support Purpose: enhance coping skills and increase insight  Name: Marisa Palmer Date of Birth: 1980-09-06  MR: [id number]    Level of Participation: active Quality of Participation: attentive, cooperative, engaged, and offered feedback Interactions with others: gave feedback Mood/Affect: appropriate Triggers (if applicable): none Cognition: coherent/clear, goal directed, and insightful Progress: Gaining insight Response: Pt participated in group and gave a positive feedback. Plan: follow-up needed  Patients Problems:  Patient Active Problem List   Diagnosis Date Noted   Alcohol use disorder, severe, dependence (HCC) 06/26/2023   Insomnia 10/14/2022   Alcoholic pancreatitis 06/21/2022   Elevated TSH 02/27/2021   Acute pancreatitis 02/25/2021   Acute alcoholic pancreatitis 01/15/2021   ETOH abuse 01/15/2021   Generalized anxiety disorder 12/17/2019   Major depressive disorder, recurrent episode (HCC) 12/17/2019

## 2023-06-26 NOTE — Progress Notes (Addendum)
Pt stayed in her room most of the shift but woke up for meals. No distress noted or concerns voiced. No withdrawal symptoms noted. Staff will monitor for pt's safety.

## 2023-06-26 NOTE — Progress Notes (Signed)
Pt is awake, alert and oriented X3. Pt complained of generalized pain. No signs of acute distress noted. PRN Acetaminophen and scheduled meds administered per order. Pt denies current SI/HI/AVH, plan or intent. Staff will monitor for pt's safety.

## 2023-06-27 DIAGNOSIS — F331 Major depressive disorder, recurrent, moderate: Secondary | ICD-10-CM | POA: Diagnosis not present

## 2023-06-27 DIAGNOSIS — F10230 Alcohol dependence with withdrawal, uncomplicated: Secondary | ICD-10-CM | POA: Diagnosis not present

## 2023-06-27 DIAGNOSIS — F17210 Nicotine dependence, cigarettes, uncomplicated: Secondary | ICD-10-CM | POA: Diagnosis not present

## 2023-06-27 DIAGNOSIS — F419 Anxiety disorder, unspecified: Secondary | ICD-10-CM | POA: Diagnosis not present

## 2023-06-27 MED ORDER — FLUOXETINE HCL 10 MG PO CAPS
10.0000 mg | ORAL_CAPSULE | Freq: Every day | ORAL | Status: DC
  Administered 2023-06-27 – 2023-06-29 (×3): 10 mg via ORAL
  Filled 2023-06-27: qty 1
  Filled 2023-06-27: qty 14
  Filled 2023-06-27 (×2): qty 1

## 2023-06-27 NOTE — ED Notes (Signed)
Patient resting with eyes closed in no apparent acute distress. Respirations even and unlabored. Environment secured. Safety checks in place according to facility policy.

## 2023-06-27 NOTE — ED Notes (Signed)
 Pt is in the dayroom watching TV with peers. Pt denies SI/HI/AVH. Pt has no further complain.No acute distress noted. Will continue to monitor for safety and provide support.

## 2023-06-27 NOTE — ED Notes (Signed)
 Pt was provided lunch

## 2023-06-27 NOTE — Group Note (Signed)
Group Topic: Overcoming Obstacles  Group Date: 06/27/2023 Start Time: 0930 End Time: 1015 Facilitators: Londell Moh, NT  Department: [facility]  Number of Participants: 8  Group Focus: check in and daily focus Treatment Modality:  Psychoeducation Interventions utilized were patient education and support Purpose: express feelings and increase insight  Name: JULLIETTE HAGEMAN Date of Birth: January 23, 1981  MR: [id number]    Level of Participation: moderate Quality of Participation: attentive Interactions with others: gave feedback Mood/Affect: appropriate Triggers (if applicable): n/a Cognition: coherent/clear Progress: Moderate Response: Pt was able to share her goals for the day. Plan: patient will be encouraged to attend future groups.  Patients Problems:  Patient Active Problem List   Diagnosis Date Noted   Alcohol use disorder, severe, dependence (HCC) 06/26/2023   Insomnia 10/14/2022   Alcoholic pancreatitis 06/21/2022   Elevated TSH 02/27/2021   Acute pancreatitis 02/25/2021   Acute alcoholic pancreatitis 01/15/2021   ETOH abuse 01/15/2021   Generalized anxiety disorder 12/17/2019   Major depressive disorder, recurrent episode (HCC) 12/17/2019

## 2023-06-27 NOTE — Group Note (Signed)
Group Topic: Overcoming Obstacles  Group Date: 06/27/2023 Start Time: 2030 End Time: 2115 Facilitators: Vassie Loll, RN  Department: [facility]  Number of Participants: 4  Group Focus: coping skills and relapse prevention Treatment Modality:  Psychoeducation Interventions utilized were support Purpose: enhance coping skills and relapse prevention strategies  Name: Marisa Palmer Date of Birth: 09-07-1980  MR: [id number]    Level of Participation: active Quality of Participation: attentive Interactions with others: gave feedback Mood/Affect: appropriate Triggers (if applicable):   Cognition: coherent/clear Progress: Moderate Response:   Plan: follow-up needed  Patients Problems:  Patient Active Problem List   Diagnosis Date Noted   Alcohol use disorder, severe, dependence (HCC) 06/26/2023   Insomnia 10/14/2022   Alcoholic pancreatitis 06/21/2022   Elevated TSH 02/27/2021   Acute pancreatitis 02/25/2021   Acute alcoholic pancreatitis 01/15/2021   ETOH abuse 01/15/2021   Generalized anxiety disorder 12/17/2019   Major depressive disorder, recurrent episode (HCC) 12/17/2019

## 2023-06-27 NOTE — ED Notes (Signed)
Pt was provided dinner.

## 2023-06-27 NOTE — ED Notes (Signed)
PRN Atarax given for anxiety. Medication administered with no complications. Environment secured, safety checks in place per facility policy.

## 2023-06-27 NOTE — Care Management (Signed)
[facility] Care Management   Per Marcelino Duster, patient was accepted to [facility] on Thursday 06-29-2023 at 9am.   [address] [place], [place]  [phone]

## 2023-06-27 NOTE — ED Notes (Signed)
Pt requested for atarax for anxiety.

## 2023-06-27 NOTE — ED Notes (Signed)
Patient is sleeping. Respirations equal and unlabored, skin warm and dry. No change in assessment or acuity. Routine safety checks conducted according to facility protocol. Will continue to monitor for safety.   

## 2023-06-27 NOTE — ED Notes (Signed)
Pt sleeping at present, no distress noted.  Monitoring for safety. 

## 2023-06-27 NOTE — Group Note (Signed)
Group Topic: Relapse and Recovery  Group Date: 06/27/2023 Start Time: 1730 End Time: 1740 Facilitators: Oleva Koo, Jacklynn Barnacle, RN  Department: [facility]  Number of Participants: 7  Group Focus: discharge education Treatment Modality:  Individual Therapy Interventions utilized were assignment and support Purpose: suicide safety planning  Name: Marisa Palmer Date of Birth: 18-Apr-1981  MR: [id number]    Level of Participation: active Quality of Participation: attentive Interactions with others: n/a - individualized therapy Mood/Affect: appropriate Triggers (if applicable): identified on form Cognition: coherent/clear Progress: Gaining insight Response: patient completed form and returned completed form to Clinical research associate, copy made for patient reference upon discharge Plan: patient will be encouraged to refer back to suicide safety plan as needed, reach out to support system, or a professional if neccessary  Patients Problems:  Patient Active Problem List   Diagnosis Date Noted   Alcohol use disorder, severe, dependence (HCC) 06/26/2023   Insomnia 10/14/2022   Alcoholic pancreatitis 06/21/2022   Elevated TSH 02/27/2021   Acute pancreatitis 02/25/2021   Acute alcoholic pancreatitis 01/15/2021   ETOH abuse 01/15/2021   Generalized anxiety disorder 12/17/2019   Major depressive disorder, recurrent episode (HCC) 12/17/2019

## 2023-06-27 NOTE — ED Provider Notes (Signed)
Behavioral Health Progress Note  Date and Time: 06/27/2023 9:35 AM Name: Marisa Palmer MRN:  [id number]  Subjective:  Marisa Palmer is a 42 y.o. 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   Patient seen in in her room, no acute distress. Patient reports feeling "anxious" today. Patient reports fair sleep and poor appetite. Regarding withdrawal symptoms, she reports chills and anxiety in which she feels the Ativan taper is helping. She is agreeable to starting an antidepressant to further aid her psychiatric symptoms. I discussed the risks and benefits of Prozac. Regarding cravings, she deneis. Patient denies current SI, HI, and AVH. Regarding discharge plans, she continues to want to go to a residential facility after discharging.   Diagnosis:  Final diagnoses:  Alcohol dependence with uncomplicated withdrawal (HCC)    Total Time spent with patient: 20 minutes  Past Psychiatric History: MDD, anxiety, alcohol use  Past Medical History: scoliosis Social History: lived with parents, unemployed, upcoming sourt date on December 12th, family is her support system Substance History:  Smoking: 20 pack year history Alcohol:              Onset: 42 years old             Amount and frequency: 15 bottles of four lokos daily             Last use: Yesterday             Hx of withdrawal symptoms: experiences alcohol withdrawal symptoms of increased anxiety, palpitation, chills, N/V within 5-6 hours of abstaining from alcohol consumption; denies history of alcohol withdraw seizure or DTs               Periods of sobriety: yes, sober for a few months but relapsed 3 months ago Illicit drugs: previously used opioids Rehab: Yes, was able to stop opioid use   Additional Social History:    Pain Medications: See MAR Prescriptions: See MAR Over the Counter: See MAR History of alcohol / drug  use?: Yes Longest period of sobriety (when/how long): Per chart, a long time ago Negative Consequences of Use: Work / Programmer, multimedia, Surveyor, quantity Withdrawal Symptoms: Irritability, Weakness, Tremors Name of Substance 1: ETOH 1 - Age of First Use: UTA 1 - Amount (size/oz): 5-6 Malt Liquor Bootleggers Drinks 1 - Frequency: Daily 1 - Duration: UTA 1 - Last Use / Amount: 06/25/23 1 - Method of Aquiring: UTA 1- Route of Use: UTA                   Current Medications:  Current Facility-Administered Medications  Medication Dose Route Frequency Provider Last Rate Last Admin   acetaminophen (TYLENOL) tablet 650 mg  650 mg Oral Q6H PRN Ajibola, Ene A, NP   650 mg at 06/26/23 0817   alum & mag hydroxide-simeth (MAALOX/MYLANTA) 200-200-20 MG/5ML suspension 30 mL  30 mL Oral Q4H PRN Ajibola, Ene A, NP       FLUoxetine (PROZAC) capsule 10 mg  10 mg Oral Daily Kizzie Ide B, MD       hydrOXYzine (ATARAX) tablet 25 mg  25 mg Oral Q6H PRN Ajibola, Ene A, NP   25 mg at 06/26/23 0235   loperamide (IMODIUM) capsule 2-4 mg  2-4 mg Oral PRN Ajibola, Ene A, NP       LORazepam (ATIVAN) tablet 1 mg  1 mg Oral Q6H PRN Ajibola, Ene A,  NP   1 mg at 06/27/23 0622   LORazepam (ATIVAN) tablet 1 mg  1 mg Oral TID Kizzie Ide B, MD   1 mg at 06/27/23 1610   Followed by   Melene Muller ON 06/28/2023] LORazepam (ATIVAN) tablet 1 mg  1 mg Oral BID Lance Muss, MD       Followed by   Melene Muller ON 06/29/2023] LORazepam (ATIVAN) tablet 1 mg  1 mg Oral Daily Kizzie Ide B, MD       magnesium hydroxide (MILK OF MAGNESIA) suspension 30 mL  30 mL Oral Daily PRN Ajibola, Ene A, NP       multivitamin with minerals tablet 1 tablet  1 tablet Oral Daily Ajibola, Ene A, NP   1 tablet at 06/27/23 0910   nicotine (NICODERM CQ - dosed in mg/24 hours) patch 14 mg  14 mg Transdermal Daily Kizzie Ide B, MD   14 mg at 06/27/23 0910   ondansetron (ZOFRAN-ODT) disintegrating tablet 4 mg  4 mg Oral Q6H PRN Ajibola, Ene A, NP       thiamine  (VITAMIN B1) tablet 100 mg  100 mg Oral Daily Ajibola, Ene A, NP   100 mg at 06/27/23 0910   traZODone (DESYREL) tablet 50 mg  50 mg Oral QHS PRN Ajibola, Ene A, NP   50 mg at 06/26/23 2105   Current Outpatient Medications  Medication Sig Dispense Refill   acetaminophen (TYLENOL) 500 MG tablet Take 1,000 mg by mouth every 6 (six) hours as needed (For pain).     Ayr Saline Nasal No-Drip GEL Place 1 spray into both nostrils every 2 (two) hours as needed (For congestion).     diphenhydrAMINE (BENADRYL) 25 mg capsule Take 50 mg by mouth at bedtime as needed for sleep.     ibuprofen (ADVIL) 200 MG tablet Take 400 mg by mouth every 6 (six) hours as needed (For pain).     ondansetron (ZOFRAN-ODT) 4 MG disintegrating tablet Take 4 mg by mouth every 6 (six) hours as needed for nausea or vomiting.      Labs  Lab Results:  Admission on 06/25/2023, Discharged on 06/25/2023  Component Date Value Ref Range Status   WBC 06/25/2023 9.3  4.0 - 10.5 K/uL Final   RBC 06/25/2023 4.55  3.87 - 5.11 MIL/uL Final   Hemoglobin 06/25/2023 15.1 (H)  12.0 - 15.0 g/dL Final   HCT 96/10/5407 44.7  36.0 - 46.0 % Final   MCV 06/25/2023 98.2  80.0 - 100.0 fL Final   MCH 06/25/2023 33.2  26.0 - 34.0 pg Final   MCHC 06/25/2023 33.8  30.0 - 36.0 g/dL Final   RDW 81/19/1478 12.8  11.5 - 15.5 % Final   Platelets 06/25/2023 282  150 - 400 K/uL Final   nRBC 06/25/2023 0.0  0.0 - 0.2 % Final   Neutrophils Relative % 06/25/2023 40  % Final   Neutro Abs 06/25/2023 3.7  1.7 - 7.7 K/uL Final   Lymphocytes Relative 06/25/2023 54  % Final   Lymphs Abs 06/25/2023 5.0 (H)  0.7 - 4.0 K/uL Final   Monocytes Relative 06/25/2023 5  % Final   Monocytes Absolute 06/25/2023 0.5  0.1 - 1.0 K/uL Final   Eosinophils Relative 06/25/2023 1  % Final   Eosinophils Absolute 06/25/2023 0.1  0.0 - 0.5 K/uL Final   Basophils Relative 06/25/2023 0  % Final   Basophils Absolute 06/25/2023 0.0  0.0 - 0.1 K/uL Final   Immature Granulocytes 06/25/2023  0  % Final   Abs Immature Granulocytes 06/25/2023 0.04  0.00 - 0.07 K/uL Final   Performed at [facility], [address], [place], [place] [address]   Sodium 06/25/2023 142  135 - 145 mmol/L Final   Potassium 06/25/2023 4.2  3.5 - 5.1 mmol/L Final   Chloride 06/25/2023 104  98 - 111 mmol/L Final   CO2 06/25/2023 26  22 - 32 mmol/L Final   Glucose, Bld 06/25/2023 121 (H)  70 - 99 mg/dL Final   Glucose reference range applies only to samples taken after fasting for at least 8 hours.   BUN 06/25/2023 8  6 - 20 mg/dL Final   Creatinine, Ser 06/25/2023 0.62  0.44 - 1.00 mg/dL Final   Calcium 60/45/4098 9.8  8.9 - 10.3 mg/dL Final   Total Protein 11/91/4782 7.8  6.5 - 8.1 g/dL Final   Albumin 95/62/1308 3.9  3.5 - 5.0 g/dL Final   AST 65/78/4696 65 (H)  15 - 41 U/L Final   ALT 06/25/2023 62 (H)  0 - 44 U/L Final   Alkaline Phosphatase 06/25/2023 186 (H)  38 - 126 U/L Final   Total Bilirubin 06/25/2023 0.5  <1.2 mg/dL Final   GFR, Estimated 06/25/2023 >60  >60 mL/min Final   Comment: (NOTE) Calculated using the CKD-EPI Creatinine Equation (2021)    Anion gap 06/25/2023 12  5 - 15 Final   Performed at [facility], [address] [place], [place] [address]   Hgb A1c MFr Bld 06/25/2023 5.1  4.8 - 5.6 % Final   Comment: (NOTE) Pre diabetes:          5.7%-6.4%  Diabetes:              >6.4%  Glycemic control for   <7.0% adults with diabetes    Mean Plasma Glucose 06/25/2023 99.67  mg/dL Final   Performed at [facility], [address] [place], [place] [address]   Alcohol, Ethyl (B) 06/25/2023 188 (H)  <10 mg/dL Final   Comment: (NOTE) Lowest detectable limit for serum alcohol is 10 mg/dL.  For medical purposes only. Performed at [facility], [address], [place], [place] [address]    POC Amphetamine UR 06/25/2023 None Detected  NONE DETECTED (Cut Off Level 1000 ng/mL) Final   POC Secobarbital (BAR) 06/25/2023 None Detected  NONE DETECTED (Cut Off Level  300 ng/mL) Final   POC Buprenorphine (BUP) 06/25/2023 None Detected  NONE DETECTED (Cut Off Level 10 ng/mL) Final   POC Oxazepam (BZO) 06/25/2023 None Detected  NONE DETECTED (Cut Off Level 300 ng/mL) Final   POC Cocaine UR 06/25/2023 None Detected  NONE DETECTED (Cut Off Level 300 ng/mL) Final   POC Methamphetamine UR 06/25/2023 None Detected  NONE DETECTED (Cut Off Level 1000 ng/mL) Final   POC Morphine 06/25/2023 None Detected  NONE DETECTED (Cut Off Level 300 ng/mL) Final   POC Methadone UR 06/25/2023 None Detected  NONE DETECTED (Cut Off Level 300 ng/mL) Final   POC Oxycodone UR 06/25/2023 None Detected  NONE DETECTED (Cut Off Level 100 ng/mL) Final   POC Marijuana UR 06/25/2023 None Detected  NONE DETECTED (Cut Off Level 50 ng/mL) Final   Preg Test, Ur 06/25/2023 NEGATIVE  NEGATIVE Final   Comment:        THE SENSITIVITY OF THIS METHODOLOGY IS >25 mIU/mL. Performed at [facility], [address], [place], [place] [address]    Cholesterol 06/25/2023  224 (H)  0 - 200 mg/dL Final   Triglycerides 19/14/7829 564 (H)  <150 mg/dL Final   HDL 56/21/3086 49  >40 mg/dL Final   Total CHOL/HDL Ratio 06/25/2023 4.6  RATIO Final   VLDL 06/25/2023 UNABLE TO CALCULATE IF TRIGLYCERIDE OVER 400 mg/dL  0 - 40 mg/dL Final   LDL Cholesterol 06/25/2023 UNABLE TO CALCULATE IF TRIGLYCERIDE OVER 400 mg/dL  0 - 99 mg/dL Final   Comment:        Total Cholesterol/HDL:CHD Risk Coronary Heart Disease Risk Table                     Men   Women  1/2 Average Risk   3.4   3.3  Average Risk       5.0   4.4  2 X Average Risk   9.6   7.1  3 X Average Risk  23.4   11.0        Use the calculated Patient Ratio above and the CHD Risk Table to determine the patient's CHD Risk.        ATP III CLASSIFICATION (LDL):  <100     mg/dL   Optimal  578-469  mg/dL   Near or Above                    Optimal  130-159  mg/dL   Borderline  629-528  mg/dL   High  >413     mg/dL   Very High Performed at [facility], [address] [place], [place] [address]    TSH 06/25/2023 3.070  0.350 - 4.500 uIU/mL Final   Comment: Performed by a 3rd Generation assay with a functional sensitivity of <=0.01 uIU/mL. Performed at [facility], [address], [place], [place] [address]    Direct LDL 06/25/2023 86  0 - 99 mg/dL Final   Comment: (NOTE) Performed At: [facility] [address] [place], [place] [id number] Jolene Schimke MD QQ:[id number]     Blood Alcohol level:  Lab Results  Component Value Date   ETH 188 (H) 06/25/2023   ETH 215 (H) 10/14/2022    Metabolic Disorder Labs: Lab Results  Component Value Date   HGBA1C 5.1 06/25/2023   MPG 99.67 06/25/2023   No results found for: "PROLACTIN" Lab Results  Component Value Date   CHOL 224 (H) 06/25/2023   TRIG 564 (H) 06/25/2023   HDL 49 06/25/2023   CHOLHDL 4.6 06/25/2023   VLDL UNABLE TO CALCULATE IF TRIGLYCERIDE OVER 400 mg/dL 33/29/5188   LDLCALC UNABLE TO CALCULATE IF TRIGLYCERIDE OVER 400 mg/dL 41/66/0630    Therapeutic Lab Levels: No results found for: "LITHIUM" No results found for: "VALPROATE" No results found for: "CBMZ"  Physical Findings   PHQ2-9    Flowsheet Row ED from 06/25/2023 in [facility]  PHQ-2 Total Score 2  PHQ-9 Total Score 15      Flowsheet Row ED [facility] Most recent reading at 06/25/2023 11:56 PM ED from 06/25/2023 in [facility] Most recent reading at 06/25/2023  9:52 PM ED from 10/14/2022 in [facility] Emergency Department at [facility] Most recent reading at 10/14/2022  5:39 AM  C-SSRS RISK CATEGORY No Risk No Risk High Risk        Musculoskeletal  Strength & Muscle Tone: within normal limits Gait & Station: normal Patient leans: N/A  Psychiatric Specialty  Exam  Presentation  General Appearance:  Casual; Appropriate for Environment  Eye  Contact: Fair  Speech: Clear and Coherent; Normal Rate  Speech Volume: Normal  Handedness: Right   Mood and Affect  Mood: Anxious; Depressed  Affect: Congruent   Thought Process  Thought Processes: Coherent; Goal Directed; Linear  Descriptions of Associations:Intact  Orientation:Full (Time, Place and Person)  Thought Content:Logical; WDL  Diagnosis of Schizophrenia or Schizoaffective disorder in past: No    Hallucinations:Hallucinations: None  Ideas of Reference:None  Suicidal Thoughts:Suicidal Thoughts: No  Homicidal Thoughts:Homicidal Thoughts: No   Sensorium  Memory: Remote Good  Judgment: Fair  Insight: Fair   Art therapist  Concentration: Good  Attention Span: Good  Recall: Good  Fund of Knowledge: Good  Language: Good   Psychomotor Activity  Psychomotor Activity: Psychomotor Activity: Normal   Assets  Assets: Resilience; Communication Skills   Sleep  Sleep: Sleep: Fair   Nutritional Assessment (For OBS and FBC admissions only) Has the patient had a weight loss or gain of 10 pounds or more in the last 3 months?: No Has the patient had a decrease in food intake/or appetite?: No Does the patient have dental problems?: No Does the patient have eating habits or behaviors that may be indicators of an eating disorder including binging or inducing vomiting?: No Has the patient recently lost weight without trying?: 0 Has the patient been eating poorly because of a decreased appetite?: 0 Malnutrition Screening Tool Score: 0    Physical Exam  Physical Exam Vitals reviewed.  Constitutional:      Appearance: Normal appearance.  HENT:     Head: Normocephalic and atraumatic.  Cardiovascular:     Rate and Rhythm: Normal rate.  Pulmonary:     Effort: Pulmonary effort is normal.  Neurological:     Mental Status: She is alert and oriented to person, place, and time. Mental status is at baseline.    Review of  Systems  Constitutional:  Positive for chills.  Gastrointestinal:  Negative for nausea and vomiting.  Neurological:  Negative for headaches.   Blood pressure 108/76, pulse 96, temperature 98.1 F (36.7 C), temperature source Tympanic, resp. rate 20, SpO2 99%. There is no height or weight on file to calculate BMI.  Treatment Plan Summary: Alcohol Use Disorder -Ativan taper-last day 12/5 -CIWA with Ativan as needed for CIWA greater than 10             -Last CIWA score is 7 on 12/3 at 6 AM -Thiamine 100 mg IM first day and PO after that -Multivitamin with minerals daily -Tylenol 650 mg every 6 hours as needed for pain -Zofran 4 mg every 6 hours as needed for nausea or vomiting -Imodium 2 to 4 mg as needed for diarrhea or loose stools  -Maalox/Mylanta 30 mL every 4 hours as needed for indigestion -Milk of Mag 30 mL as needed for constipation   Tobacco Use Disorder -Nicotine patch ordered  MDD, recurrent, moderate Anxiety -Start Prozac 10 mg daily PRN medications: -Hydroxyzine 25 mg q6h PRN for anxiety -Trazodone 50 mg qhs PRN for sleep  [facility]po: [facility]    Lance Muss, MD 06/27/2023 9:35 AM

## 2023-06-27 NOTE — ED Notes (Signed)
Patient alert & oriented x4. Denies intent to harm self or others when asked. Denies A/VH. Patient reports pain 5/10 in back, denies pain medication at this time stating the pain is chronic and they're "used to it". No acute distress noted. Support and encouragement provided. Routine safety checks conducted per facility protocol. Encouraged patient to notify staff if any thoughts of harm towards self or others arise. Patient verbalizes understanding and agreement.

## 2023-06-28 DIAGNOSIS — F419 Anxiety disorder, unspecified: Secondary | ICD-10-CM | POA: Diagnosis not present

## 2023-06-28 DIAGNOSIS — F17210 Nicotine dependence, cigarettes, uncomplicated: Secondary | ICD-10-CM | POA: Diagnosis not present

## 2023-06-28 DIAGNOSIS — F10230 Alcohol dependence with withdrawal, uncomplicated: Secondary | ICD-10-CM | POA: Diagnosis not present

## 2023-06-28 DIAGNOSIS — F331 Major depressive disorder, recurrent, moderate: Secondary | ICD-10-CM | POA: Diagnosis not present

## 2023-06-28 MED ORDER — FLUOXETINE HCL 10 MG PO CAPS: 10.0000 mg | ORAL_CAPSULE | Freq: Every day | ORAL | 0 refills | Status: AC

## 2023-06-28 MED ORDER — HYDROXYZINE HCL 25 MG PO TABS: 25.0000 mg | ORAL_TABLET | Freq: Four times a day (QID) | ORAL | 0 refills | Status: AC | PRN

## 2023-06-28 MED ORDER — ENSURE ENLIVE PO LIQD
237.0000 mL | Freq: Two times a day (BID) | ORAL | Status: DC
Start: 2023-06-28 — End: 2023-06-29
  Administered 2023-06-28 – 2023-06-29 (×2): 237 mL via ORAL

## 2023-06-28 MED ORDER — TRAZODONE HCL 50 MG PO TABS: 50.0000 mg | ORAL_TABLET | Freq: Every evening | ORAL | 0 refills | Status: AC | PRN

## 2023-06-28 MED ORDER — NICOTINE 14 MG/24HR TD PT24: 14.0000 mg | MEDICATED_PATCH | Freq: Every day | TRANSDERMAL | 0 refills | Status: AC

## 2023-06-28 NOTE — Discharge Instructions (Addendum)
Follow-up recommendations:  Activity:  Normal, as tolerated Diet:  Per PCP recommendation  Patient is instructed prior to discharge to: Take all medications as prescribed by her mental healthcare provider. Report any adverse effects and/or reactions from the medicines to her outpatient provider promptly. Patient has been instructed & cautioned: To not engage in alcohol and or illegal drug use while on prescription medicines.  In the event of worsening symptoms, patient is instructed to call the crisis hotline at 988, 911 and or go to the nearest ED for appropriate evaluation and treatment of symptoms. To follow-up with her primary care provider for your other medical issues, concerns and or health care needs.   SUBSTANCE USE TREATMENT for Medicaid and State Funded/IPRS  [facility] [address], [place], [address] [phone] phone NOTE: [facility] is no longer offering IOP services.  Serves those who are low-income or have no insurance.  [facility] [address] [place], [place], [address] [phone] phone [phone] fax NOTE: Does have Substance Abuse-Intensive Outpatient Program [facility]) as well as transitional housing if eligible.  [facility] [address] [place], [place], [address] [phone] phone [phone] fax  [facility] [phone] [place], [place], [address] [phone] phone [phone] fax  HALFWAY HOUSES:  [facility] [phone]  [employer].oxfordvacancies.com  12 STEP PROGRAMS:  Alcoholics Anonymous of [place] SoftwareChalet.be  Narcotics Anonymous of [place] [employer]ct.dk  Al-Anon of [place], [place] www.[place]alanon.org/find-meetings.html  Nar-Anon https://nar-anon.org/find-a-meetin  List of Residential placements:   [facility] in [place]: [phone]  [facility]  [facility]: [phone]  [place], [place] [phone]: Female and female facility; 30-day program: (uninsured and Medicaid such as Laurena Bering, [place], [place], partners)  [facility]: [phone]; men and women's facility; 28 days; Can have Medicaid tailored plan Tour manager or Partners)  [facility]: [phone] Karoline Caldwell or Larita Fife; 28 day program; must be fully detox; tailored Medicaid or no insurance  [address] in [place], [place]; [phone]; 28 day all males program; no insurance accepted  [facility] Referral in [place]: Gabriel Rung [phone] (no insurance or Medicaid only); 90 days; outpatient services but provide housing in apartments [place]  [facility] Admission: [phone]: Patient must complete phone screening for placement: [place], [place]; 6 month program; uninsured, Medicaid, and [employer].   [facility]: no insurance required; [phone]  [facility]: [phone]; Intake: Molly Maduro; Must fill out application online; Alecia Lemming Delay [phone] x [address] [facility] in [place], [place]: [phone]; Admissions Coordinators Mr. Maurine Minister or Barron Alvine; 90 day program.  [facility]: [place], [place] [phone]; Co-Ed 9 month to a year program; Online application; Men entry fee is $500 (6-24months);  [employer]: [address] [place], [place] [address]; no fee or insurance required; minimum of 2 years; Highly structured; work based; Intake Coordinator is Thayer Ohm [phone]  [facility] in [place], [place]: [phone]; Fax number is [phone]; website: www.[facility].org; Requires 3-6 page autobiography; 2 year program (18 months and then 71month transitional housing); Admission fee is $300; no insurance needed; work Automotive engineer in [place], [place]: [employer] Desk Staff: Danise Edge [phone]: They have a Men's Regenerations Program 6-4months. Free program; There is an  initial $300 fee however, they are willing to work with patients regarding that. Application is online.  [facility] at [facility]: Admissions [phone] Doran Heater ext [phone]; Any 7-90 day program is out of pocket; 12 month program is free of charge; there  is a $275 entry fee; Patient is responsible for own transportation

## 2023-06-28 NOTE — ED Notes (Signed)
[facility] provided bathroom supplies and scrubs for pt.

## 2023-06-28 NOTE — ED Notes (Signed)
 Pt is in the dayroom watching TV with peers. Pt denies SI/HI/AVH. Pt has no further complain.No acute distress noted. Will continue to monitor for safety and provide support.

## 2023-06-28 NOTE — ED Notes (Signed)
Pt came to the nursing station requesting for medication for anxiety. Medication was administered to that effect.

## 2023-06-28 NOTE — ED Notes (Signed)
Patient remains asleep in bed without issue or complaint.  Will monitor.   

## 2023-06-28 NOTE — Group Note (Signed)
Group Topic: Recovery Basics  Group Date: 06/28/2023 Start Time: 1200 End Time: 1220 Facilitators: Jenean Lindau, RN  Department: [facility]  Number of Participants: 7  Group Focus: chemical dependency education, chemical dependency issues, clarity of thought, and coping skills Treatment Modality:  Patient-Centered Therapy Interventions utilized were patient education Purpose: increase insight  Name: Marisa Palmer Date of Birth: 11-12-1980  MR: [id number]    Level of Participation: moderate Quality of Participation: attentive and cooperative Interactions with others: gave feedback Mood/Affect: appropriate Triggers (if applicable):   Cognition: coherent/clear Progress: Gaining insight Response:   Plan: follow-up needed  Patients Problems:  Patient Active Problem List   Diagnosis Date Noted   Alcohol use disorder, severe, dependence (HCC) 06/26/2023   Insomnia 10/14/2022   Alcoholic pancreatitis 06/21/2022   Elevated TSH 02/27/2021   Acute pancreatitis 02/25/2021   Acute alcoholic pancreatitis 01/15/2021   ETOH abuse 01/15/2021   Generalized anxiety disorder 12/17/2019   Major depressive disorder, recurrent episode (HCC) 12/17/2019

## 2023-06-28 NOTE — ED Notes (Signed)
Patient received vistaril at noon however reports that it did not help her with anxiety and she feels that she is going to has "a full blown anxiety attack."  RN offered coping techniques however patient not very interested in them and was focused on getting an additional medication.  Dr Oda Cogan notified and no new orders prescribed.  Writer again reinforced techniques to help with patients feelings.  She is tearful but calm.  Patient is pending discharge tomorrow.  Will monitor.

## 2023-06-28 NOTE — Group Note (Signed)
Group Topic: Positive Affirmations  Group Date: 06/28/2023 Start Time: 1000 End Time: 1100 Facilitators: Ninfa Linden, NT +3 [facility] 2 Department: [facility]  Number of Participants: 4  Group Focus: self-awareness Treatment Modality:  Psychoeducation Interventions utilized were group exercise Purpose: regain self-worth  Name: Marisa Palmer Date of Birth: Oct 23, 1980  MR: [id number]    Level of Participation: active Quality of Participation: attentive Interactions with others: gave feedback Mood/Affect: appropriate Triggers (if applicable): N/A Cognition: coherent/clear Progress: Moderate Response: Appropriate  Plan: patient will be encouraged to continue to keep close ties with her family for support.  Patients Problems:  Patient Active Problem List   Diagnosis Date Noted   Alcohol use disorder, severe, dependence (HCC) 06/26/2023   Insomnia 10/14/2022   Alcoholic pancreatitis 06/21/2022   Elevated TSH 02/27/2021   Acute pancreatitis 02/25/2021   Acute alcoholic pancreatitis 01/15/2021   ETOH abuse 01/15/2021   Generalized anxiety disorder 12/17/2019   Major depressive disorder, recurrent episode (HCC) 12/17/2019

## 2023-06-28 NOTE — Group Note (Signed)
Group Topic: Communication  Group Date: 06/28/2023 Start Time: 1950 End Time: 2020 Facilitators: Rae Lips B  Department: [facility]  Number of Participants: 5  Group Focus: check in Treatment Modality:  Individual Therapy Interventions utilized were support Purpose: express feelings and Check in and see how their day was and if there was anything they wanted to talk about.   Name: Marisa Palmer Date of Birth: [birth year]  MR: [id number]    Level of Participation: active Quality of Participation: attentive, cooperative, and initiates communication Interactions with others: gave feedback Mood/Affect: appropriate, brightens with interaction, and positive Triggers (if applicable): Drinking and her living situation Cognition: coherent/clear, goal directed, insightful, and logical Progress: Gaining insight Response: She is hopeful and wants to change her life. She was sober but slipped.  Plan: patient will be encouraged to keep attending groups.   Patients Problems:  Patient Active Problem List   Diagnosis Date Noted   Alcohol use disorder, severe, dependence (HCC) 06/26/2023   Insomnia 10/14/2022   Alcoholic pancreatitis 06/21/2022   Elevated TSH 02/27/2021   Acute pancreatitis 02/25/2021   Acute alcoholic pancreatitis 01/15/2021   ETOH abuse 01/15/2021   Generalized anxiety disorder 12/17/2019   Major depressive disorder, recurrent episode (HCC) 12/17/2019

## 2023-06-28 NOTE — ED Notes (Signed)
Patient is sleeping. Respirations equal and unlabored, skin warm and dry. No change in assessment or acuity. Routine safety checks conducted according to facility protocol. Will continue to monitor for safety.   

## 2023-06-28 NOTE — ED Provider Notes (Signed)
Behavioral Health Progress Note  Date and Time: 06/28/2023 9:47 AM Name: Marisa Palmer MRN:  [id number]  Subjective:  Marisa Palmer is a 42 y.o. 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   Patient seen in her room, no acute distress. Patient reports feeling "anxious" today. Patient reports fair sleep and poor appetite. Regarding withdrawal symptoms, she reports mainly anxiety. She is agreeable to start Ensure in between meals to aid with appetite. Patient denies current SI, HI, and AVH. Regarding discharge plans, she is excited and nervous with the plan to go to [facility] tomorrow. I provided some motivational interviewing regarding her substance use.    Diagnosis:  Final diagnoses:  Alcohol dependence with uncomplicated withdrawal (HCC)    Total Time spent with patient: 20 minutes  Past Psychiatric History: MDD, anxiety, alcohol use  Past Medical History: scoliosis Social History: lived with parents, unemployed, upcoming sourt date on December 12th, family is her support system Substance History:  Smoking: 20 pack year history Alcohol:              Onset: 42 years old             Amount and frequency: 15 bottles of four lokos daily             Last use: Yesterday             Hx of withdrawal symptoms: experiences alcohol withdrawal symptoms of increased anxiety, palpitation, chills, N/V within 5-6 hours of abstaining from alcohol consumption; denies history of alcohol withdraw seizure or DTs               Periods of sobriety: yes, sober for a few months but relapsed 3 months ago Illicit drugs: previously used opioids Rehab: Yes, was able to stop opioid use   Additional Social History:    Pain Medications: See MAR Prescriptions: See MAR Over the Counter: See MAR History of alcohol / drug use?: Yes Longest period of sobriety (when/how long): Per chart, a long time  ago Negative Consequences of Use: Work / Programmer, multimedia, Surveyor, quantity Withdrawal Symptoms: Irritability, Weakness, Tremors Name of Substance 1: ETOH 1 - Age of First Use: UTA 1 - Amount (size/oz): 5-6 Malt Liquor [employer]leggers Drinks 1 - Frequency: Daily 1 - Duration: UTA 1 - Last Use / Amount: 06/25/23 1 - Method of Aquiring: UTA 1- Route of Use: UTA                   Current Medications:  Current Facility-Administered Medications  Medication Dose Route Frequency Provider Last Rate Last Admin   acetaminophen (TYLENOL) tablet 650 mg  650 mg Oral Q6H PRN Ajibola, Ene A, NP   650 mg at 06/26/23 0817   alum & mag hydroxide-simeth (MAALOX/MYLANTA) 200-200-20 MG/5ML suspension 30 mL  30 mL Oral Q4H PRN Ajibola, Ene A, NP       feeding supplement (ENSURE ENLIVE / ENSURE PLUS) liquid 237 mL  237 mL Oral BID BM Kizzie Ide B, MD   237 mL at 06/28/23 0932   FLUoxetine (PROZAC) capsule 10 mg  10 mg Oral Daily Kizzie Ide B, MD   10 mg at 06/28/23 0932   hydrOXYzine (ATARAX) tablet 25 mg  25 mg Oral Q6H PRN Ajibola, Ene A, NP   25 mg at 06/28/23 0402   loperamide (IMODIUM) capsule 2-4 mg  2-4 mg Oral PRN Ajibola, Ene A, NP  LORazepam (ATIVAN) tablet 1 mg  1 mg Oral Q6H PRN Ajibola, Ene A, NP   1 mg at 06/27/23 0622   LORazepam (ATIVAN) tablet 1 mg  1 mg Oral BID Kizzie Ide B, MD   1 mg at 06/28/23 0932   Followed by   Melene Muller ON 06/29/2023] LORazepam (ATIVAN) tablet 1 mg  1 mg Oral Daily Kizzie Ide B, MD       magnesium hydroxide (MILK OF MAGNESIA) suspension 30 mL  30 mL Oral Daily PRN Ajibola, Ene A, NP       multivitamin with minerals tablet 1 tablet  1 tablet Oral Daily Ajibola, Ene A, NP   1 tablet at 06/28/23 0932   nicotine (NICODERM CQ - dosed in mg/24 hours) patch 14 mg  14 mg Transdermal Daily Kizzie Ide B, MD   14 mg at 06/28/23 0932   ondansetron (ZOFRAN-ODT) disintegrating tablet 4 mg  4 mg Oral Q6H PRN Ajibola, Ene A, NP       thiamine (VITAMIN B1) tablet 100 mg  100  mg Oral Daily Ajibola, Ene A, NP   100 mg at 06/28/23 0933   traZODone (DESYREL) tablet 50 mg  50 mg Oral QHS PRN Ajibola, Ene A, NP   50 mg at 06/27/23 2104   Current Outpatient Medications  Medication Sig Dispense Refill   acetaminophen (TYLENOL) 500 MG tablet Take 1,000 mg by mouth every 6 (six) hours as needed (For pain).     [place] Saline Nasal No-Drip GEL Place 1 spray into both nostrils every 2 (two) hours as needed (For congestion).     diphenhydrAMINE (BENADRYL) 25 mg capsule Take 50 mg by mouth at bedtime as needed for sleep.     ibuprofen (ADVIL) 200 MG tablet Take 400 mg by mouth every 6 (six) hours as needed (For pain).     ondansetron (ZOFRAN-ODT) 4 MG disintegrating tablet Take 4 mg by mouth every 6 (six) hours as needed for nausea or vomiting.      Labs  Lab Results:  Admission on 06/25/2023, Discharged on 06/25/2023  Component Date Value Ref Range Status   WBC 06/25/2023 9.3  4.0 - 10.5 K/uL Final   RBC 06/25/2023 4.55  3.87 - 5.11 MIL/uL Final   Hemoglobin 06/25/2023 15.1 (H)  12.0 - 15.0 g/dL Final   HCT 62/13/0865 44.7  36.0 - 46.0 % Final   MCV 06/25/2023 98.2  80.0 - 100.0 fL Final   MCH 06/25/2023 33.2  26.0 - 34.0 pg Final   MCHC 06/25/2023 33.8  30.0 - 36.0 g/dL Final   RDW 78/46/9629 12.8  11.5 - 15.5 % Final   Platelets 06/25/2023 282  150 - 400 K/uL Final   nRBC 06/25/2023 0.0  0.0 - 0.2 % Final   Neutrophils Relative % 06/25/2023 40  % Final   Neutro Abs 06/25/2023 3.7  1.7 - 7.7 K/uL Final   Lymphocytes Relative 06/25/2023 54  % Final   Lymphs Abs 06/25/2023 5.0 (H)  0.7 - 4.0 K/uL Final   Monocytes Relative 06/25/2023 5  % Final   Monocytes Absolute 06/25/2023 0.5  0.1 - 1.0 K/uL Final   Eosinophils Relative 06/25/2023 1  % Final   Eosinophils Absolute 06/25/2023 0.1  0.0 - 0.5 K/uL Final   Basophils Relative 06/25/2023 0  % Final   Basophils Absolute 06/25/2023 0.0  0.0 - 0.1 K/uL Final   Immature Granulocytes 06/25/2023 0  % Final   Abs Immature  Granulocytes 06/25/2023 0.04  0.00 -  0.07 K/uL Final   Performed at [facility], [address] [place], [place] [address]   Sodium 06/25/2023 142  135 - 145 mmol/L Final   Potassium 06/25/2023 4.2  3.5 - 5.1 mmol/L Final   Chloride 06/25/2023 104  98 - 111 mmol/L Final   CO2 06/25/2023 26  22 - 32 mmol/L Final   Glucose, Bld 06/25/2023 121 (H)  70 - 99 mg/dL Final   Glucose reference range applies only to samples taken after fasting for at least 8 hours.   BUN 06/25/2023 8  6 - 20 mg/dL Final   Creatinine, Ser 06/25/2023 0.62  0.44 - 1.00 mg/dL Final   Calcium 60/45/4098 9.8  8.9 - 10.3 mg/dL Final   Total Protein 11/91/4782 7.8  6.5 - 8.1 g/dL Final   Albumin 95/62/1308 3.9  3.5 - 5.0 g/dL Final   AST 65/78/4696 65 (H)  15 - 41 U/L Final   ALT 06/25/2023 62 (H)  0 - 44 U/L Final   Alkaline Phosphatase 06/25/2023 186 (H)  38 - 126 U/L Final   Total Bilirubin 06/25/2023 0.5  <1.2 mg/dL Final   GFR, Estimated 06/25/2023 >60  >60 mL/min Final   Comment: (NOTE) Calculated using the CKD-EPI Creatinine Equation (2021)    Anion gap 06/25/2023 12  5 - 15 Final   Performed at [facility], [address], [place], [place] [address]   Hgb A1c MFr Bld 06/25/2023 5.1  4.8 - 5.6 % Final   Comment: (NOTE) Pre diabetes:          5.7%-6.4%  Diabetes:              >6.4%  Glycemic control for   <7.0% adults with diabetes    Mean Plasma Glucose 06/25/2023 99.67  mg/dL Final   Performed at [facility], [address], [place], [place] [address]   Alcohol, Ethyl (B) 06/25/2023 188 (H)  <10 mg/dL Final   Comment: (NOTE) Lowest detectable limit for serum alcohol is 10 mg/dL.  For medical purposes only. Performed at [facility], [address] [place], [place] [address]    POC Amphetamine UR 06/25/2023 None Detected  NONE DETECTED (Cut Off Level 1000 ng/mL) Final   POC Secobarbital (BAR) 06/25/2023 None Detected  NONE DETECTED (Cut Off Level 300 ng/mL) Final   POC  Buprenorphine (BUP) 06/25/2023 None Detected  NONE DETECTED (Cut Off Level 10 ng/mL) Final   POC Oxazepam (BZO) 06/25/2023 None Detected  NONE DETECTED (Cut Off Level 300 ng/mL) Final   POC Cocaine UR 06/25/2023 None Detected  NONE DETECTED (Cut Off Level 300 ng/mL) Final   POC Methamphetamine UR 06/25/2023 None Detected  NONE DETECTED (Cut Off Level 1000 ng/mL) Final   POC Morphine 06/25/2023 None Detected  NONE DETECTED (Cut Off Level 300 ng/mL) Final   POC Methadone UR 06/25/2023 None Detected  NONE DETECTED (Cut Off Level 300 ng/mL) Final   POC Oxycodone UR 06/25/2023 None Detected  NONE DETECTED (Cut Off Level 100 ng/mL) Final   POC Marijuana UR 06/25/2023 None Detected  NONE DETECTED (Cut Off Level 50 ng/mL) Final   Preg Test, Ur 06/25/2023 NEGATIVE  NEGATIVE Final   Comment:        THE SENSITIVITY OF THIS METHODOLOGY IS >25 mIU/mL. Performed at [facility], [address] [place], [place] [address]    Cholesterol 06/25/2023 224 (H)  0 - 200 mg/dL Final   Triglycerides 64/40/3474 564 (H)  <  150 mg/dL Final   HDL 16/04/9603 49  >40 mg/dL Final   Total CHOL/HDL Ratio 06/25/2023 4.6  RATIO Final   VLDL 06/25/2023 UNABLE TO CALCULATE IF TRIGLYCERIDE OVER 400 mg/dL  0 - 40 mg/dL Final   LDL Cholesterol 06/25/2023 UNABLE TO CALCULATE IF TRIGLYCERIDE OVER 400 mg/dL  0 - 99 mg/dL Final   Comment:        Total Cholesterol/HDL:CHD Risk Coronary Heart Disease Risk Table                     Men   Women  1/2 Average Risk   3.4   3.3  Average Risk       5.0   4.4  2 X Average Risk   9.6   7.1  3 X Average Risk  23.4   11.0        Use the calculated Patient Ratio above and the CHD Risk Table to determine the patient's CHD Risk.        ATP III CLASSIFICATION (LDL):  <100     mg/dL   Optimal  540-981  mg/dL   Near or Above                    Optimal  130-159  mg/dL   Borderline  191-478  mg/dL   High  >295     mg/dL   Very High Performed at [facility], [address], [place], [place] [address]    TSH 06/25/2023 3.070  0.350 - 4.500 uIU/mL Final   Comment: Performed by a 3rd Generation assay with a functional sensitivity of <=0.01 uIU/mL. Performed at [facility], [address], [place], [place] [address]    Direct LDL 06/25/2023 86  0 - 99 mg/dL Final   Comment: (NOTE) Performed At: [facility] [address] [place], [place] [id number] Jolene Schimke MD UX:[id number]     Blood Alcohol level:  Lab Results  Component Value Date   ETH 188 (H) 06/25/2023   ETH 215 (H) 10/14/2022    Metabolic Disorder Labs: Lab Results  Component Value Date   HGBA1C 5.1 06/25/2023   MPG 99.67 06/25/2023   No results found for: "PROLACTIN" Lab Results  Component Value Date   CHOL 224 (H) 06/25/2023   TRIG 564 (H) 06/25/2023   HDL 49 06/25/2023   CHOLHDL 4.6 06/25/2023   VLDL UNABLE TO CALCULATE IF TRIGLYCERIDE OVER 400 mg/dL 53/66/4403   LDLCALC UNABLE TO CALCULATE IF TRIGLYCERIDE OVER 400 mg/dL 47/42/5956    Therapeutic Lab Levels: No results found for: "LITHIUM" No results found for: "VALPROATE" No results found for: "CBMZ"  Physical Findings   PHQ2-9    Flowsheet Row ED from 06/25/2023 in [facility]  PHQ-2 Total Score 2  PHQ-9 Total Score 15      Flowsheet Row ED from 06/25/2023 in [facility] Most recent reading at 06/25/2023 11:56 PM ED from 06/25/2023 in [facility] Most recent reading at 06/25/2023  9:52 PM ED from 10/14/2022 in [facility] Emergency Department at [facility] Most recent reading at 10/14/2022  5:39 AM  C-SSRS RISK CATEGORY No Risk No Risk High Risk        Musculoskeletal  Strength & Muscle Tone: within normal limits Gait & Station: normal Patient leans: N/A  Psychiatric Specialty Exam  Presentation  General Appearance:  Casual; Appropriate for Environment  Eye Contact: Fair  Speech: Clear and  Coherent; Normal Rate  Speech Volume: Normal  Handedness: Right   Mood and Affect  Mood: Anxious; Depressed  Affect: Congruent   Thought Process  Thought Processes: Coherent; Goal Directed; Linear  Descriptions of Associations:Intact  Orientation:Full (Time, Place and Person)  Thought Content:Logical; WDL  Diagnosis of Schizophrenia or Schizoaffective disorder in past: No    Hallucinations:Hallucinations: None  Ideas of Reference:None  Suicidal Thoughts:Suicidal Thoughts: No  Homicidal Thoughts:Homicidal Thoughts: No   Sensorium  Memory: Remote Good  Judgment: Fair  Insight: Fair   Art therapist  Concentration: Good  Attention Span: Good  Recall: Good  Fund of Knowledge: Good  Language: Good   Psychomotor Activity  Psychomotor Activity: Psychomotor Activity: Normal   Assets  Assets: Resilience; Communication Skills   Sleep  Sleep: Sleep: Fair   No data recorded   Physical Exam  Physical Exam Vitals reviewed.  Constitutional:      Appearance: Normal appearance.  HENT:     Head: Normocephalic and atraumatic.  Cardiovascular:     Rate and Rhythm: Normal rate.  Pulmonary:     Effort: Pulmonary effort is normal.  Neurological:     Mental Status: She is alert and oriented to person, place, and time. Mental status is at baseline.    Review of Systems  Constitutional:  Positive for chills.  Gastrointestinal:  Negative for nausea and vomiting.  Neurological:  Negative for headaches.  Psychiatric/Behavioral:  The patient is nervous/anxious.    Blood pressure (!) 109/94, pulse 79, temperature 97.9 F (36.6 C), temperature source Oral, resp. rate 16, SpO2 98%. There is no height or weight on file to calculate BMI.  Treatment Plan Summary: Alcohol Use Disorder -Ativan taper-last day 12/5 -CIWA with Ativan as needed for CIWA greater than 10             -Last CIWA score is 2 on 12/4 at 6 AM -Thiamine 100 mg IM  first day and PO after that -Multivitamin with minerals daily -Start ensure for oral supplement -Tylenol 650 mg every 6 hours as needed for pain -Zofran 4 mg every 6 hours as needed for nausea or vomiting -Imodium 2 to 4 mg as needed for diarrhea or loose stools  -Maalox/Mylanta 30 mL every 4 hours as needed for indigestion -Milk of Mag 30 mL as needed for constipation   Tobacco Use Disorder -Nicotine patch ordered  MDD, recurrent, moderate Anxiety -Continue Prozac 10 mg daily PRN medications: -Hydroxyzine 25 mg q6h PRN for anxiety -Trazodone 50 mg qhs PRN for sleep  [facility] Thursday  Lance Muss, MD 06/28/2023 9:47 AM

## 2023-06-28 NOTE — Discharge Planning (Signed)
LCSW spoke with patient on today to discuss disposition plan.  Patient reports her plan is not to discharge to [facility] on tomorrow, as she has court on July 06, 2023.  Patient reports she has spoken with her father and the family is willing to support her with her court issues, and allow her to return home with outpatient follow-up.  Patient reports an interest in outpatient follow-up in [place] or the [facility] area.  Patient has provided permission for LCSW to contact her father Mr. Tawanna Sat [phone].  Patient aware that LCSW will provide update once received.  LCSW contacted the patient's father Mr. Tawanna Sat at number listed above.  Mr. Tawanna Sat confirmed that patient is able to return home at discharge, and he will pick her up on tomorrow at 12:30 PM.  Father reports family's plan is to support the patient with her court issues, and continue to support her with outpatient follow-up.  Father reports no concerns with the patient returning home at discharge once stable.  No other needs were reported at this time by father.  LCSW went and spoke with patient to provide update.  Patient made aware that discharge plan will be for tomorrow at 12:30 and with transportation provided by father.  Patient reports appreciation for LCSW assistance at this time.  Patient aware that outpatient appointments will be in her discharge AVS for her follow-up. Update has been provided to MD. No other concerns to report at this time. LCSW will continue to follow and provide support to patient while on [facility] unit.  Fernande Boyden, LCSW Clinical Social Worker [facility] Ph: [phone]

## 2023-06-29 DIAGNOSIS — F331 Major depressive disorder, recurrent, moderate: Secondary | ICD-10-CM | POA: Diagnosis not present

## 2023-06-29 DIAGNOSIS — F419 Anxiety disorder, unspecified: Secondary | ICD-10-CM | POA: Diagnosis not present

## 2023-06-29 DIAGNOSIS — F17210 Nicotine dependence, cigarettes, uncomplicated: Secondary | ICD-10-CM | POA: Diagnosis not present

## 2023-06-29 DIAGNOSIS — F10230 Alcohol dependence with withdrawal, uncomplicated: Secondary | ICD-10-CM | POA: Diagnosis not present

## 2023-06-29 LAB — LDL CHOLESTEROL, DIRECT: Direct LDL: 86 mg/dL (ref 0–99)

## 2023-06-29 NOTE — ED Notes (Signed)
PT got up stating she hasn't been able to sleep she's been up and down all night.

## 2023-06-29 NOTE — ED Notes (Signed)
Patient A&O x 4, ambulatory. Patient discharged in no acute distress. Patient denied SI/HI, A/VH upon discharge. Patient verbalized understanding of all discharge instructions explained by staff, to include follow up appointments, RX's and safety plan. Patient reported mood 10/10.  Pt belongings returned to patient from locker #21 intact. Patient escorted to lobby via staff for transport to destination. Safety maintained.

## 2023-06-29 NOTE — ED Notes (Signed)
Patient A&Ox4. Denies intent to harm self/others when asked. Denies A/VH. Patient denies any physical complaints when asked. No acute distress noted. Support and encouragement provided. Routine safety checks conducted according to facility protocol. Encouraged patient to notify staff if thoughts of harm toward self or others arise. Patient verbalize understanding and agreement. Will continue to monitor for safety.    

## 2023-06-29 NOTE — ED Provider Notes (Signed)
[facility]BC/OBS ASAP Discharge Summary  Date and Time: 06/29/2023 8:24 AM  Name: Marisa Palmer  MRN:  [id number]   Discharge Diagnoses:  Final diagnoses:  Alcohol dependence with uncomplicated withdrawal (HCC)    Subjective: Marisa Palmer is a 42 y.o. 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   Patient feels ready to return home. She plans to go to her court and follow-up with outpatient services.   Stay Summary: The patient was evaluated each day by a clinical provider to ascertain response to treatment. Improvement was noted by the patient's report of decreasing symptoms, improved sleep and appetite, affect, medication tolerance, behavior, and participation in unit programming.  Patient was asked each day to complete a self inventory noting mood, mental status, pain, new symptoms, anxiety and concerns.  The patient's medications were managed with the following directions: Alcohol Use Disorder -Ativan taper-last day 12/5 -Thiamine 100 mg IM first day and PO after that -Multivitamin with minerals daily -Ensure for oral supplement -Tylenol 650 mg every 6 hours as needed for pain -Zofran 4 mg every 6 hours as needed for nausea or vomiting -Imodium 2 to 4 mg as needed for diarrhea or loose stools  -Maalox/Mylanta 30 mL every 4 hours as needed for indigestion -Milk of Mag 30 mL as needed for constipation   Tobacco Use Disorder -Nicotine patch ordered  MDD, recurrent, moderate Anxiety -Continue Prozac 10 mg daily PRN medications: -Hydroxyzine 25 mg q6h PRN for anxiety -Trazodone 50 mg qhs PRN for sleep  Patient responded well to medication and being in a therapeutic and supportive environment. Positive and appropriate behavior was noted and the patient was motivated for recovery. The patient worked closely with the treatment team and case manager to develop a discharge plan  with appropriate goals. Coping skills, problem solving as well as relaxation therapies were also part of the unit programming.   Total Time spent with patient: 30 minutes   Past Psychiatric History: MDD, anxiety, alcohol use  Past Medical History: scoliosis Social History: lived with parents, unemployed, upcoming sourt date on December 12th, family is her support system Substance History:  Smoking: 20 pack year history Alcohol:              Onset: 42 years old             Amount and frequency: 15 bottles of four lokos daily             Last use: Yesterday             Hx of withdrawal symptoms: experiences alcohol withdrawal symptoms of increased anxiety, palpitation, chills, N/V within 5-6 hours of abstaining from alcohol consumption; denies history of alcohol withdraw seizure or DTs               Periods of sobriety: yes, sober for a few months but relapsed 3 months ago Illicit drugs: previously used opioids Rehab: Yes, was able to stop opioid use  Tobacco Cessation:  A prescription for an FDA-approved tobacco cessation medication provided at discharge  Current Medications:  Current Facility-Administered Medications  Medication Dose Route Frequency Provider Last Rate Last Admin   acetaminophen (TYLENOL) tablet 650 mg  650 mg Oral Q6H PRN Ajibola, Ene A, NP   650 mg at 06/26/23 0817   alum & mag hydroxide-simeth (MAALOX/MYLANTA) 200-200-20 MG/5ML suspension 30 mL  30 mL Oral Q4H PRN Ajibola, Ene A, NP  feeding supplement (ENSURE ENLIVE / ENSURE PLUS) liquid 237 mL  237 mL Oral BID BM Kizzie Ide B, MD   237 mL at 06/28/23 0932   FLUoxetine (PROZAC) capsule 10 mg  10 mg Oral Daily Kizzie Ide B, MD   10 mg at 06/28/23 0932   LORazepam (ATIVAN) tablet 1 mg  1 mg Oral Daily Kizzie Ide B, MD       magnesium hydroxide (MILK OF MAGNESIA) suspension 30 mL  30 mL Oral Daily PRN Ajibola, Ene A, NP       multivitamin with minerals tablet 1 tablet  1 tablet Oral Daily Ajibola, Ene A,  NP   1 tablet at 06/28/23 0932   nicotine (NICODERM CQ - dosed in mg/24 hours) patch 14 mg  14 mg Transdermal Daily Kizzie Ide B, MD   14 mg at 06/28/23 0932   thiamine (VITAMIN B1) tablet 100 mg  100 mg Oral Daily Ajibola, Ene A, NP   100 mg at 06/28/23 0933   traZODone (DESYREL) tablet 50 mg  50 mg Oral QHS PRN Ajibola, Ene A, NP   50 mg at 06/28/23 2105   Current Outpatient Medications  Medication Sig Dispense Refill   Ayr Saline Nasal No-Drip GEL Place 1 spray into both nostrils every 2 (two) hours as needed (For congestion).     diphenhydrAMINE (BENADRYL) 25 mg capsule Take 50 mg by mouth at bedtime as needed for sleep.     ibuprofen (ADVIL) 200 MG tablet Take 400 mg by mouth every 6 (six) hours as needed (For pain).     ondansetron (ZOFRAN-ODT) 4 MG disintegrating tablet Take 4 mg by mouth every 6 (six) hours as needed for nausea or vomiting.     FLUoxetine (PROZAC) 10 MG capsule Take 1 capsule (10 mg total) by mouth daily. 30 capsule 0   hydrOXYzine (ATARAX) 25 MG tablet Take 1 tablet (25 mg total) by mouth every 6 (six) hours as needed for anxiety. 30 tablet 0   nicotine (NICODERM CQ - DOSED IN MG/24 HOURS) 14 mg/24hr patch Place 1 patch (14 mg total) onto the skin daily. 28 patch 0   traZODone (DESYREL) 50 MG tablet Take 1 tablet (50 mg total) by mouth at bedtime as needed for sleep. 30 tablet 0    PTA Medications:  Facility Ordered Medications  Medication   acetaminophen (TYLENOL) tablet 650 mg   alum & mag hydroxide-simeth (MAALOX/MYLANTA) 200-200-20 MG/5ML suspension 30 mL   magnesium hydroxide (MILK OF MAGNESIA) suspension 30 mL   traZODone (DESYREL) tablet 50 mg   [COMPLETED] thiamine (VITAMIN B1) injection 100 mg   thiamine (VITAMIN B1) tablet 100 mg   multivitamin with minerals tablet 1 tablet   [EXPIRED] LORazepam (ATIVAN) tablet 1 mg   [EXPIRED] hydrOXYzine (ATARAX) tablet 25 mg   [EXPIRED] loperamide (IMODIUM) capsule 2-4 mg   [EXPIRED] ondansetron (ZOFRAN-ODT)  disintegrating tablet 4 mg   [COMPLETED] LORazepam (ATIVAN) tablet 1 mg   Followed by   [COMPLETED] LORazepam (ATIVAN) tablet 1 mg   Followed by   [COMPLETED] LORazepam (ATIVAN) tablet 1 mg   Followed by   LORazepam (ATIVAN) tablet 1 mg   nicotine (NICODERM CQ - dosed in mg/24 hours) patch 14 mg   FLUoxetine (PROZAC) capsule 10 mg   feeding supplement (ENSURE ENLIVE / ENSURE PLUS) liquid 237 mL   PTA Medications  Medication Sig   Ayr Saline Nasal No-Drip GEL Place 1 spray into both nostrils every 2 (two) hours as needed (For congestion).  ondansetron (ZOFRAN-ODT) 4 MG disintegrating tablet Take 4 mg by mouth every 6 (six) hours as needed for nausea or vomiting.   diphenhydrAMINE (BENADRYL) 25 mg capsule Take 50 mg by mouth at bedtime as needed for sleep.   ibuprofen (ADVIL) 200 MG tablet Take 400 mg by mouth every 6 (six) hours as needed (For pain).   FLUoxetine (PROZAC) 10 MG capsule Take 1 capsule (10 mg total) by mouth daily.   nicotine (NICODERM CQ - DOSED IN MG/24 HOURS) 14 mg/24hr patch Place 1 patch (14 mg total) onto the skin daily.   hydrOXYzine (ATARAX) 25 MG tablet Take 1 tablet (25 mg total) by mouth every 6 (six) hours as needed for anxiety.   traZODone (DESYREL) 50 MG tablet Take 1 tablet (50 mg total) by mouth at bedtime as needed for sleep.       06/29/2023    8:23 AM 06/28/2023   12:13 PM 06/26/2023   10:25 AM  Depression screen PHQ 2/9  Decreased Interest 1 1 1   Down, Depressed, Hopeless 1 1 1   PHQ - 2 Score 2 2 2   Altered sleeping 1 2 3   Tired, decreased energy 1 2 2   Change in appetite 1 3 3   Feeling bad or failure about yourself  1 2 2   Trouble concentrating 1 1 1   Moving slowly or fidgety/restless 1 2 2   Suicidal thoughts 0 0 0  PHQ-9 Score 8 14 15     Flowsheet Row ED from 06/25/2023 in [facility] Most recent reading at 06/25/2023 11:56 PM ED from 06/25/2023 in [facility] Most recent reading at  06/25/2023  9:52 PM ED from 10/14/2022 in [facility] at [facility] Most recent reading at 10/14/2022  5:39 AM  C-SSRS RISK CATEGORY No Risk No Risk High Risk       Musculoskeletal  Strength & Muscle Tone: within normal limits Gait & Station: normal Patient leans: N/A  Psychiatric Specialty Exam  Presentation  General Appearance:  Appropriate for Environment; Casual  Eye Contact: Fair  Speech: Clear and Coherent; Normal Rate  Speech Volume: Normal  Handedness: Right   Mood and Affect  Mood: Anxious; Depressed  Affect: Congruent   Thought Process  Thought Processes: Coherent; Goal Directed; Linear  Descriptions of Associations:Intact  Orientation:Full (Time, Place and Person)  Thought Content:Logical; WDL  Diagnosis of Schizophrenia or Schizoaffective disorder in past: No    Hallucinations:Hallucinations: None  Ideas of Reference:None  Suicidal Thoughts:Suicidal Thoughts: No  Homicidal Thoughts:Homicidal Thoughts: No   Sensorium  Memory: Remote Good  Judgment: Fair  Insight: Fair   Art therapist  Concentration: Good  Attention Span: Good  Recall: Good  Fund of Knowledge: Good  Language: Good   Psychomotor Activity  Psychomotor Activity: Psychomotor Activity: Normal   Assets  Assets: Communication Skills; Desire for Improvement; Resilience   Sleep  Sleep: Sleep: Fair   Physical Exam  Physical Exam Vitals reviewed.  Constitutional:      Appearance: Normal appearance.  HENT:     Head: Normocephalic and atraumatic.  Cardiovascular:     Rate and Rhythm: Tachycardia present.  Pulmonary:     Effort: Pulmonary effort is normal.  Neurological:     Mental Status: She is alert and oriented to person, place, and time.    Review of Systems  Constitutional: Negative.   Respiratory: Negative.    Cardiovascular: Negative.   Gastrointestinal: Negative.   Genitourinary: Negative.    Neurological: Negative.  Blood pressure 109/72, pulse (!) 104, temperature 98.3 F (36.8 C), temperature source Oral, resp. rate 17, SpO2 100%. There is no height or weight on file to calculate BMI.  Demographic Factors:  Caucasian  Loss Factors: Legal issues  Historical Factors: Impulsivity  Risk Reduction Factors:   Living with another person, especially a relative, Positive social support, and Positive coping skills or problem solving skills  Continued Clinical Symptoms:  Alcohol/Substance Abuse/Dependencies  Cognitive Features That Contribute To Risk:  Closed-mindedness    Suicide Risk:  Mild:  Suicidal ideation of limited frequency, intensity, duration, and specificity.  There are no identifiable plans, no associated intent, mild dysphoria and related symptoms, good self-control (both objective and subjective assessment), few other risk factors, and identifiable protective factors, including available and accessible social support.  Plan Of Care/Follow-up recommendations:  Activity as tolerated Regular diet Continue prescription medications See PCP for medical conditions   Disposition: Home with OP services  Lance Muss, MD 06/29/2023, 8:24 AM

## 2023-06-29 NOTE — ED Notes (Signed)
Patient is sleeping. Respirations equal and unlabored, skin warm and dry. No change in assessment or acuity. Routine safety checks conducted according to facility protocol. Will continue to monitor for safety.
